# Patient Record
Sex: Female | Born: 1995 | Race: Black or African American | Hispanic: No | Marital: Married | Smoking: Never smoker
Health system: Southern US, Community
[De-identification: ages and names within clinical notes are randomized; demographics above are authoritative.]

## PROBLEM LIST (undated history)

## (undated) DIAGNOSIS — J45909 Unspecified asthma, uncomplicated: Secondary | ICD-10-CM

## (undated) DIAGNOSIS — O24419 Gestational diabetes mellitus in pregnancy, unspecified control: Secondary | ICD-10-CM

## (undated) DIAGNOSIS — Z789 Other specified health status: Secondary | ICD-10-CM

## (undated) DIAGNOSIS — K5900 Constipation, unspecified: Secondary | ICD-10-CM

## (undated) DIAGNOSIS — R519 Headache, unspecified: Secondary | ICD-10-CM

## (undated) DIAGNOSIS — E739 Lactose intolerance, unspecified: Secondary | ICD-10-CM

## (undated) DIAGNOSIS — R0602 Shortness of breath: Secondary | ICD-10-CM

## (undated) DIAGNOSIS — M2550 Pain in unspecified joint: Secondary | ICD-10-CM

## (undated) DIAGNOSIS — M549 Dorsalgia, unspecified: Secondary | ICD-10-CM

## (undated) DIAGNOSIS — I10 Essential (primary) hypertension: Secondary | ICD-10-CM

## (undated) DIAGNOSIS — R600 Localized edema: Secondary | ICD-10-CM

## (undated) HISTORY — DX: Headache, unspecified: R51.9

## (undated) HISTORY — DX: Shortness of breath: R06.02

## (undated) HISTORY — DX: Lactose intolerance, unspecified: E73.9

## (undated) HISTORY — DX: Unspecified asthma, uncomplicated: J45.909

## (undated) HISTORY — DX: Constipation, unspecified: K59.00

## (undated) HISTORY — DX: Pain in unspecified joint: M25.50

## (undated) HISTORY — DX: Other specified health status: Z78.9

## (undated) HISTORY — DX: Gestational diabetes mellitus in pregnancy, unspecified control: O24.419

## (undated) HISTORY — DX: Dorsalgia, unspecified: M54.9

## (undated) HISTORY — DX: Localized edema: R60.0

## (undated) HISTORY — DX: Essential (primary) hypertension: I10

## (undated) HISTORY — PX: NO PAST SURGERIES: SHX2092

---
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
## 2019-04-15 ENCOUNTER — Other Ambulatory Visit: Payer: Self-pay | Admitting: *Deleted

## 2019-04-15 DIAGNOSIS — Z20822 Contact with and (suspected) exposure to covid-19: Secondary | ICD-10-CM

## 2019-04-16 LAB — NOVEL CORONAVIRUS, NAA: SARS-CoV-2, NAA: NOT DETECTED

## 2019-04-28 ENCOUNTER — Other Ambulatory Visit: Payer: Self-pay

## 2019-04-28 DIAGNOSIS — Z20822 Contact with and (suspected) exposure to covid-19: Secondary | ICD-10-CM

## 2019-04-29 LAB — NOVEL CORONAVIRUS, NAA: SARS-CoV-2, NAA: DETECTED — AB

## 2019-05-01 ENCOUNTER — Telehealth: Payer: Self-pay | Admitting: *Deleted

## 2019-05-01 NOTE — Telephone Encounter (Signed)
Patient and husband both positive for [phone]. They have spoken previously with a nurse. Had questions regarding isolation issues. Answered questions at this time. Encouraged to talk to her pcp with any further concerns.

## 2019-05-01 NOTE — Telephone Encounter (Signed)
This encounter was created in error - please disregard.

## 2019-07-15 ENCOUNTER — Other Ambulatory Visit: Payer: Self-pay

## 2019-07-15 DIAGNOSIS — Z20822 Contact with and (suspected) exposure to covid-19: Secondary | ICD-10-CM

## 2019-07-17 LAB — NOVEL CORONAVIRUS, NAA: SARS-CoV-2, NAA: NOT DETECTED

## 2019-11-22 ENCOUNTER — Ambulatory Visit: Payer: BC Managed Care – PPO

## 2019-11-22 ENCOUNTER — Ambulatory Visit: Payer: BC Managed Care – PPO | Attending: Internal Medicine

## 2019-11-22 DIAGNOSIS — Z23 Encounter for immunization: Secondary | ICD-10-CM

## 2019-11-22 NOTE — Progress Notes (Signed)
   Covid-19 Vaccination Clinic  Name:  BLEN RANSOME    MRN: [id number] DOB: 1996-03-31  11/22/2019  Ms. Criss Alvine was observed post Covid-19 immunization for 15 minutes without incident. She was provided with Vaccine Information Sheet and instruction to access the V-Safe system.   Ms. Criss Alvine was instructed to call 911 with any severe reactions post vaccine: Marland Kitchen Difficulty breathing  . Swelling of face and throat  . A fast heartbeat  . A bad rash all over body  . Dizziness and weakness   Immunizations Administered    Name Date Dose VIS Date Route   Pfizer COVID-19 Vaccine 11/22/2019 11:21 AM 0.3 mL 07/25/2019 Intramuscular   Manufacturer: [employer], [employer]   Lot: [id number]   NDC: [address]-3

## 2019-11-26 ENCOUNTER — Other Ambulatory Visit: Payer: Self-pay

## 2019-11-26 ENCOUNTER — Ambulatory Visit: Payer: BC Managed Care – PPO | Attending: Internal Medicine

## 2019-11-26 DIAGNOSIS — Z20822 Contact with and (suspected) exposure to covid-19: Secondary | ICD-10-CM

## 2019-11-27 LAB — NOVEL CORONAVIRUS, NAA: SARS-CoV-2, NAA: NOT DETECTED

## 2019-11-27 LAB — SARS-COV-2, NAA 2 DAY TAT

## 2019-12-15 ENCOUNTER — Ambulatory Visit: Payer: BC Managed Care – PPO | Attending: Internal Medicine

## 2019-12-15 DIAGNOSIS — Z23 Encounter for immunization: Secondary | ICD-10-CM

## 2019-12-15 NOTE — Progress Notes (Signed)
   Covid-19 Vaccination Clinic  Name:  EMBERLEY KRAL    MRN: [id number] DOB: 1996/01/03  12/15/2019  Ms. Criss Alvine was observed post Covid-19 immunization for 15 minutes without incident. She was provided with Vaccine Information Sheet and instruction to access the V-Safe system.   Ms. Criss Alvine was instructed to call 911 with any severe reactions post vaccine: Marland Kitchen Difficulty breathing  . Swelling of face and throat  . A fast heartbeat  . A bad rash all over body  . Dizziness and weakness   Immunizations Administered    Name Date Dose VIS Date Route   Pfizer COVID-19 Vaccine 12/15/2019 12:37 PM 0.3 mL 10/08/2018 Intramuscular   Manufacturer: [employer], [employer]   Lot: [id number]   NDC: [address]-5

## 2020-03-31 ENCOUNTER — Other Ambulatory Visit: Payer: Self-pay | Admitting: Critical Care Medicine

## 2020-03-31 ENCOUNTER — Other Ambulatory Visit: Payer: BC Managed Care – PPO

## 2020-03-31 DIAGNOSIS — Z20822 Contact with and (suspected) exposure to covid-19: Secondary | ICD-10-CM

## 2020-04-02 LAB — SARS-COV-2, NAA 2 DAY TAT

## 2020-04-02 LAB — NOVEL CORONAVIRUS, NAA: SARS-CoV-2, NAA: NOT DETECTED

## 2020-04-07 ENCOUNTER — Other Ambulatory Visit: Payer: Self-pay

## 2020-06-07 LAB — OB RESULTS CONSOLE GC/CHLAMYDIA
Chlamydia: NEGATIVE
Gonorrhea: NEGATIVE

## 2020-06-07 LAB — OB RESULTS CONSOLE ABO/RH: RH Type: POSITIVE

## 2020-06-07 LAB — OB RESULTS CONSOLE HEPATITIS B SURFACE ANTIGEN: Hepatitis B Surface Ag: NEGATIVE

## 2020-06-07 LAB — OB RESULTS CONSOLE RUBELLA ANTIBODY, IGM: Rubella: IMMUNE

## 2020-06-07 LAB — OB RESULTS CONSOLE RPR: RPR: NONREACTIVE

## 2020-06-07 LAB — OB RESULTS CONSOLE HIV ANTIBODY (ROUTINE TESTING): HIV: NONREACTIVE

## 2020-06-07 LAB — OB RESULTS CONSOLE ANTIBODY SCREEN: Antibody Screen: NEGATIVE

## 2020-08-14 NOTE — L&D Delivery Note (Signed)
Delivery Note Arrived to room approximately 1745. Patient was pushing and fetus was at +1 station. Excellent maternal effort was noted with pushing. Pushing occurred for greater than 2 hours. Given some decelerations which occurred during pushing, patient was consented to vacuum assisted delivery. We discussed risks/benefits/alternatives to vacuum delivery.   Fetal heart tracing was reassuring and, therefore, pushing was continued and fetus successfully reached +2 station. The fetal heart tracing revealed fetal tachycardia with FHR in 170-180s with minimal to moderate variability and without decelerations. Due to significant fetal caput, unable to palpate sutures in order to properly place a vacuum. Dr. Macon Large (faculty practice) called to bedside to aid with evaluating placement of vacuum. It was determined that it was not safe to place the vacuum given inability to palpate sutures. Gentle fundal pressure was applied (Kristeller maneuver) which aided with delivery of the infant.  At 7:56 PM a viable female was delivered via Vaginal, Spontaneous (Presentation: Left Occiput Anterior).  APGAR: 5, 7; weight: 4130g. Terminal meconium was noted. The infant was immediately taken to the warmer. Placenta status: Spontaneous;Expressed, Intact.  Cord: 3 vessels with the following complications: None.  Cord pH: Collected  There were no cervical or perineal lacerations. Bilateral superficial vaginal sulcal tears were noted but they were found to be hemostatic and were not repaired.  Anesthesia: Epidural Episiotomy: None Lacerations: None Suture Repair: N/A Est. Blood Loss (mL): 500  Mom to postpartum.  Baby to NICU.  Steva Ready 12/30/2020, 8:30 PM

## 2020-10-11 ENCOUNTER — Inpatient Hospital Stay (HOSPITAL_COMMUNITY): Admission: AD | Admit: 2020-10-11 | Payer: BC Managed Care – PPO | Admitting: Obstetrics and Gynecology

## 2020-12-16 ENCOUNTER — Telehealth (HOSPITAL_COMMUNITY): Payer: Self-pay | Admitting: *Deleted

## 2020-12-16 ENCOUNTER — Encounter (HOSPITAL_COMMUNITY): Payer: Self-pay | Admitting: *Deleted

## 2020-12-16 NOTE — Telephone Encounter (Signed)
Preadmission screen  

## 2020-12-17 ENCOUNTER — Encounter (HOSPITAL_COMMUNITY): Payer: Self-pay | Admitting: *Deleted

## 2020-12-21 DIAGNOSIS — Z3403 Encounter for supervision of normal first pregnancy, third trimester: Secondary | ICD-10-CM

## 2020-12-21 NOTE — H&P (Signed)
HPI: 25 y.o. G1P0 @ [redacted]w[redacted]d estimated gestational age (as dated by LMP c/w 9 week ultrasound) presents for induction of labor for post-dates.  Leakage of fluid:  No Vaginal bleeding:  No Contractions:  No Fetal movement:  Yes  Prenatal care has been provided by Dr. Steva Ready [facility])  ROS:  Denies fevers, chills, chest pain, visual changes, SOB, RUQ/epigastric pain, N/V, dysuria, hematuria, or sudden onset/worsening bilateral LE or facial edema.  Pregnancy complicated by: Uncomplicated  Prenatal Transfer Tool  Maternal Diabetes: No Genetic Screening: Declined Maternal Ultrasounds/Referrals: Normal Fetal Ultrasounds or other Referrals:  None Maternal Substance Abuse:  No Significant Maternal Medications:  None Significant Maternal Lab Results: Group B Strep positive   Prenatal Labs Blood type:  O Positive Antibody screen:  Negative CBC:  H/H 12.4/36.1 Rubella: Immune RPR:  Non-reactive Hep B:  Negative Hep C:  Negative HIV:  Negative GC/CT:  Negative Glucola:  150.2 (abnormal)  3h GTT passed  Immunizations: Tdap: Given prenatally  OBHx:  OB History    Gravida  1   Para      Term      Preterm      AB      Living        SAB      IAB      Ectopic      Multiple      Live Births             PMHx:  None Meds:  PNV Allergy:  No Known Allergies SurgHx: None SocHx:   Denies Tobacco, ETOH, illicit drugs  O: There were no vitals taken for this visit. Gen. AAOx3, NAD CV.  RRR  Resp. CTAB, no wheezes/rales/rhonchi Abd. Gravid, soft, non-tender throughout, no rebound/guarding, Cephalic by [employer].  No bilateral LE edema, no calf tenderness bilaterally SVE: 1/thick/high  Labs: see orders  A/P:  25 y.o. G1P0 @ [redacted]w[redacted]d who presents for induction of labor for post-dates.  - Admit to L&D - Admit labs (CBC, T&S, COVID screen) - CEFM/Toco - Diet:  Clear liquids - IVF:  LR at 125cc/hour - VTE Prophylaxis:  SCDs - GBS Status:  Positive  - PCN ordered - Presentation:  Confirm prior to IOL - confirm via [place] - Pain control:  Per patient request - Induction method:  Cytotec for cervical ripening  Steva Ready, DO [phone] (office)

## 2020-12-27 ENCOUNTER — Other Ambulatory Visit (HOSPITAL_COMMUNITY)
Admission: RE | Admit: 2020-12-27 | Discharge: 2020-12-27 | Disposition: A | Discharge status: Home / Self Care | Payer: BC Managed Care – PPO | Source: Ambulatory Visit | Attending: Obstetrics and Gynecology | Admitting: Obstetrics and Gynecology

## 2020-12-27 DIAGNOSIS — Z20822 Contact with and (suspected) exposure to covid-19: Secondary | ICD-10-CM | POA: Insufficient documentation

## 2020-12-27 DIAGNOSIS — Z01812 Encounter for preprocedural laboratory examination: Secondary | ICD-10-CM | POA: Insufficient documentation

## 2020-12-27 LAB — SARS CORONAVIRUS 2 (TAT 6-24 HRS): SARS Coronavirus 2: NEGATIVE

## 2020-12-29 ENCOUNTER — Inpatient Hospital Stay (HOSPITAL_COMMUNITY)
Admission: AD | Admit: 2020-12-29 | Discharge: 2021-01-01 | DRG: 806 | Disposition: A | Discharge status: Home / Self Care | Payer: BC Managed Care – PPO | Attending: Obstetrics and Gynecology | Admitting: Obstetrics and Gynecology

## 2020-12-29 ENCOUNTER — Inpatient Hospital Stay (HOSPITAL_COMMUNITY): Payer: BC Managed Care – PPO

## 2020-12-29 ENCOUNTER — Inpatient Hospital Stay (HOSPITAL_BASED_OUTPATIENT_CLINIC_OR_DEPARTMENT_OTHER): Payer: BC Managed Care – PPO

## 2020-12-29 ENCOUNTER — Encounter (HOSPITAL_COMMUNITY): Payer: Self-pay | Admitting: Obstetrics and Gynecology

## 2020-12-29 ENCOUNTER — Other Ambulatory Visit: Payer: Self-pay

## 2020-12-29 DIAGNOSIS — O480 Post-term pregnancy: Secondary | ICD-10-CM

## 2020-12-29 DIAGNOSIS — Z3A40 40 weeks gestation of pregnancy: Secondary | ICD-10-CM

## 2020-12-29 DIAGNOSIS — O134 Gestational [pregnancy-induced] hypertension without significant proteinuria, complicating childbirth: Secondary | ICD-10-CM | POA: Diagnosis present

## 2020-12-29 DIAGNOSIS — O99824 Streptococcus B carrier state complicating childbirth: Secondary | ICD-10-CM | POA: Diagnosis present

## 2020-12-29 DIAGNOSIS — Z20822 Contact with and (suspected) exposure to covid-19: Secondary | ICD-10-CM | POA: Diagnosis present

## 2020-12-29 DIAGNOSIS — Z3403 Encounter for supervision of normal first pregnancy, third trimester: Secondary | ICD-10-CM

## 2020-12-29 DIAGNOSIS — O320XX0 Maternal care for unstable lie, not applicable or unspecified: Secondary | ICD-10-CM

## 2020-12-29 DIAGNOSIS — Z3689 Encounter for other specified antenatal screening: Secondary | ICD-10-CM

## 2020-12-29 LAB — CBC
HCT: 39.3 % (ref 36.0–46.0)
Hemoglobin: 13.5 g/dL (ref 12.0–15.0)
MCH: 29.2 pg (ref 26.0–34.0)
MCHC: 34.4 g/dL (ref 30.0–36.0)
MCV: 85.1 fL (ref 80.0–100.0)
Platelets: 267 10*3/uL (ref 150–400)
RBC: 4.62 MIL/uL (ref 3.87–5.11)
RDW: 12.7 % (ref 11.5–15.5)
WBC: 5.6 10*3/uL (ref 4.0–10.5)
nRBC: 0 % (ref 0.0–0.2)

## 2020-12-29 LAB — TYPE AND SCREEN
ABO/RH(D): O POS
Antibody Screen: NEGATIVE

## 2020-12-29 MED ORDER — OXYTOCIN BOLUS FROM INFUSION
333.0000 mL | Freq: Once | INTRAVENOUS | Status: AC
  Administered 2020-12-30: 333 mL via INTRAVENOUS

## 2020-12-29 MED ORDER — LIDOCAINE HCL (PF) 1 % IJ SOLN
30.0000 mL | INTRAMUSCULAR | Status: DC | PRN
  Filled 2020-12-29: qty 30

## 2020-12-29 MED ORDER — TERBUTALINE SULFATE 1 MG/ML IJ SOLN
0.2500 mg | Freq: Once | INTRAMUSCULAR | Status: DC | PRN
  Filled 2020-12-29: qty 1

## 2020-12-29 MED ORDER — MISOPROSTOL 25 MCG QUARTER TABLET
25.0000 ug | ORAL_TABLET | ORAL | Status: DC | PRN
  Administered 2020-12-29 – 2020-12-30 (×3): 25 ug via VAGINAL
  Filled 2020-12-29 (×4): qty 1

## 2020-12-29 MED ORDER — OXYCODONE-ACETAMINOPHEN 5-325 MG PO TABS: 2.0000 | ORAL_TABLET | ORAL | Status: DC | PRN

## 2020-12-29 MED ORDER — OXYCODONE-ACETAMINOPHEN 5-325 MG PO TABS: 1.0000 | ORAL_TABLET | ORAL | Status: DC | PRN

## 2020-12-29 MED ORDER — SODIUM CHLORIDE 0.9 % IV SOLN
5.0000 10*6.[IU] | Freq: Once | INTRAVENOUS | Status: AC
  Administered 2020-12-29: 5 10*6.[IU] via INTRAVENOUS
  Filled 2020-12-29: qty 5

## 2020-12-29 MED ORDER — OXYTOCIN-SODIUM CHLORIDE 30-0.9 UT/500ML-% IV SOLN
2.5000 [IU]/h | INTRAVENOUS | Status: DC
  Administered 2020-12-30: 2.5 [IU]/h via INTRAVENOUS

## 2020-12-29 MED ORDER — FENTANYL CITRATE (PF) 100 MCG/2ML IJ SOLN
50.0000 ug | INTRAMUSCULAR | Status: DC | PRN
  Administered 2020-12-30: 50 ug via INTRAVENOUS
  Filled 2020-12-29: qty 2

## 2020-12-29 MED ORDER — ACETAMINOPHEN 325 MG PO TABS: 650.0000 mg | ORAL_TABLET | ORAL | Status: DC | PRN

## 2020-12-29 MED ORDER — SOD CITRATE-CITRIC ACID 500-334 MG/5ML PO SOLN: 30.0000 mL | ORAL | Status: DC | PRN

## 2020-12-29 MED ORDER — OXYTOCIN-SODIUM CHLORIDE 30-0.9 UT/500ML-% IV SOLN
1.0000 m[IU]/min | INTRAVENOUS | Status: DC
  Administered 2020-12-30: 2 m[IU]/min via INTRAVENOUS
  Filled 2020-12-29: qty 500

## 2020-12-29 MED ORDER — ONDANSETRON HCL 4 MG/2ML IJ SOLN
4.0000 mg | Freq: Four times a day (QID) | INTRAMUSCULAR | Status: DC | PRN
  Administered 2020-12-30 (×2): 4 mg via INTRAVENOUS
  Filled 2020-12-29 (×2): qty 2

## 2020-12-29 MED ORDER — LACTATED RINGERS IV SOLN: INTRAVENOUS | Status: DC

## 2020-12-29 MED ORDER — PENICILLIN G POT IN DEXTROSE 60000 UNIT/ML IV SOLN
3.0000 10*6.[IU] | INTRAVENOUS | Status: DC
  Administered 2020-12-29 – 2020-12-30 (×4): 3 10*6.[IU] via INTRAVENOUS
  Filled 2020-12-29 (×6): qty 50

## 2020-12-29 MED ORDER — LACTATED RINGERS IV SOLN: 500.0000 mL | INTRAVENOUS | Status: DC | PRN

## 2020-12-29 NOTE — Progress Notes (Signed)
OB Progress Note  S: Patient resting comfortably. Significant other and her mother at bedside.  O: BP 130/87   Pulse 84   Temp 97.6 F (36.4 C) (Axillary)   Resp 18   Ht 5' 6.5" (1.689 m)   Wt 99.9 kg   BMI 35.02 kg/m   FHT: 150bpm, moderate variablity, + accels, - decels Toco: q4-5 minutes SVE: deferred - primary RN checked at 1747: cervix was 1/thick/-3 and posterior  Limited OB US: Confirmed cephalic presentation AFI 8.7   A/P: 25 y.o. G1P0 @ [redacted]w[redacted]d admitted for induction of labor for post-dates.  FWB: Cat. I Labor course: S/p Cytotec x 1 dose - continue to ripen cervix overnight Pain: Per patient request GBS: Positive - PCN infusing Anticipate SVD  Steva Ready, DO [phone] (office)

## 2020-12-30 ENCOUNTER — Inpatient Hospital Stay (HOSPITAL_COMMUNITY): Payer: BC Managed Care – PPO | Admitting: Anesthesiology

## 2020-12-30 ENCOUNTER — Encounter (HOSPITAL_COMMUNITY): Payer: Self-pay | Admitting: Obstetrics and Gynecology

## 2020-12-30 LAB — RPR: RPR Ser Ql: NONREACTIVE

## 2020-12-30 MED ORDER — FENTANYL-BUPIVACAINE-NACL 0.5-0.125-0.9 MG/250ML-% EP SOLN
12.0000 mL/h | EPIDURAL | Status: DC | PRN
  Administered 2020-12-30: 12 mL/h via EPIDURAL
  Filled 2020-12-30: qty 250

## 2020-12-30 MED ORDER — PHENYLEPHRINE 40 MCG/ML (10ML) SYRINGE FOR IV PUSH (FOR BLOOD PRESSURE SUPPORT)
80.0000 ug | PREFILLED_SYRINGE | INTRAVENOUS | Status: DC | PRN
  Filled 2020-12-30: qty 10

## 2020-12-30 MED ORDER — ONDANSETRON HCL 4 MG/2ML IJ SOLN: 4.0000 mg | INTRAMUSCULAR | Status: DC | PRN

## 2020-12-30 MED ORDER — ACETAMINOPHEN 325 MG PO TABS
650.0000 mg | ORAL_TABLET | ORAL | Status: DC | PRN
  Administered 2021-01-01: 650 mg via ORAL
  Filled 2020-12-30: qty 2

## 2020-12-30 MED ORDER — EPHEDRINE 5 MG/ML INJ
10.0000 mg | INTRAVENOUS | Status: DC | PRN
  Filled 2020-12-30: qty 2

## 2020-12-30 MED ORDER — WITCH HAZEL-GLYCERIN EX PADS: 1.0000 "application " | MEDICATED_PAD | CUTANEOUS | Status: DC | PRN

## 2020-12-30 MED ORDER — DIPHENHYDRAMINE HCL 25 MG PO CAPS: 25.0000 mg | ORAL_CAPSULE | Freq: Four times a day (QID) | ORAL | Status: DC | PRN

## 2020-12-30 MED ORDER — LACTATED RINGERS IV SOLN
500.0000 mL | Freq: Once | INTRAVENOUS | Status: AC
Start: 2020-12-30 — End: 2020-12-30
  Administered 2020-12-30: 500 mL via INTRAVENOUS

## 2020-12-30 MED ORDER — SIMETHICONE 80 MG PO CHEW: 80.0000 mg | CHEWABLE_TABLET | ORAL | Status: DC | PRN

## 2020-12-30 MED ORDER — COCONUT OIL OIL: 1.0000 "application " | TOPICAL_OIL | Status: DC | PRN

## 2020-12-30 MED ORDER — FENTANYL CITRATE (PF) 100 MCG/2ML IJ SOLN: 100.0000 ug | Freq: Once | INTRAMUSCULAR | Status: DC

## 2020-12-30 MED ORDER — PRENATAL MULTIVITAMIN CH
1.0000 | ORAL_TABLET | Freq: Every day | ORAL | Status: DC
  Administered 2021-01-01: 1 via ORAL
  Filled 2020-12-30 (×2): qty 1

## 2020-12-30 MED ORDER — FENTANYL CITRATE (PF) 100 MCG/2ML IJ SOLN
INTRAMUSCULAR | Status: AC
  Filled 2020-12-30: qty 2

## 2020-12-30 MED ORDER — BENZOCAINE-MENTHOL 20-0.5 % EX AERO
1.0000 "application " | INHALATION_SPRAY | CUTANEOUS | Status: DC | PRN
  Administered 2020-12-30: 1 via TOPICAL
  Filled 2020-12-30: qty 56

## 2020-12-30 MED ORDER — DIPHENHYDRAMINE HCL 50 MG/ML IJ SOLN: 12.5000 mg | INTRAMUSCULAR | Status: DC | PRN

## 2020-12-30 MED ORDER — ZOLPIDEM TARTRATE 5 MG PO TABS: 5.0000 mg | ORAL_TABLET | Freq: Every evening | ORAL | Status: DC | PRN

## 2020-12-30 MED ORDER — SENNOSIDES-DOCUSATE SODIUM 8.6-50 MG PO TABS
2.0000 | ORAL_TABLET | Freq: Every day | ORAL | Status: DC
  Administered 2021-01-01: 2 via ORAL
  Filled 2020-12-30 (×2): qty 2

## 2020-12-30 MED ORDER — DIBUCAINE (PERIANAL) 1 % EX OINT: 1.0000 "application " | TOPICAL_OINTMENT | CUTANEOUS | Status: DC | PRN

## 2020-12-30 MED ORDER — BUPIVACAINE HCL (PF) 0.25 % IJ SOLN
INTRAMUSCULAR | Status: DC | PRN
  Administered 2020-12-30 (×2): 10 mL via EPIDURAL

## 2020-12-30 MED ORDER — IBUPROFEN 800 MG PO TABS
800.0000 mg | ORAL_TABLET | Freq: Three times a day (TID) | ORAL | Status: DC
  Administered 2020-12-30 – 2021-01-01 (×6): 800 mg via ORAL
  Filled 2020-12-30 (×6): qty 1

## 2020-12-30 MED ORDER — LIDOCAINE-EPINEPHRINE (PF) 2 %-1:200000 IJ SOLN
INTRAMUSCULAR | Status: DC | PRN
  Administered 2020-12-30: 4 mL via EPIDURAL

## 2020-12-30 MED ORDER — ONDANSETRON HCL 4 MG PO TABS: 4.0000 mg | ORAL_TABLET | ORAL | Status: DC | PRN

## 2020-12-30 NOTE — Anesthesia Procedure Notes (Signed)
Epidural Patient location during procedure: OB Start time: 12/30/2020 7:40 AM End time: 12/30/2020 7:50 AM  Staffing Anesthesiologist: Elmer Picker, MD Performed: anesthesiologist   Preanesthetic Checklist Completed: patient identified, IV checked, risks and benefits discussed, monitors and equipment checked, pre-op evaluation and timeout performed  Epidural Patient position: sitting Prep: DuraPrep and site prepped and draped Patient monitoring: continuous pulse ox, blood pressure, heart rate and cardiac monitor Approach: midline Location: L3-L4 Injection technique: LOR air  Needle:  Needle type: Tuohy  Needle gauge: 17 G Needle length: 9 cm Needle insertion depth: 7 cm Catheter type: closed end flexible Catheter size: 19 Gauge Catheter at skin depth: 12 cm Test dose: negative  Assessment Sensory level: T8 Events: blood not aspirated, injection not painful, no injection resistance, no paresthesia and negative IV test  Additional Notes Patient identified. Risks/Benefits/Options discussed with patient including but not limited to bleeding, infection, nerve damage, paralysis, failed block, incomplete pain control, headache, blood pressure changes, nausea, vomiting, reactions to medication both or allergic, itching and postpartum back pain. Confirmed with bedside nurse the patient's most recent platelet count. Confirmed with patient that they are not currently taking any anticoagulation, have any bleeding history or any family history of bleeding disorders. Patient expressed understanding and wished to proceed. All questions were answered. Sterile technique was used throughout the entire procedure. Please see nursing notes for vital signs. Test dose was given through epidural catheter and negative prior to continuing to dose epidural or start infusion. Warning signs of high block given to the patient including shortness of breath, tingling/numbness in hands, complete motor block,  or any concerning symptoms with instructions to call for help. Patient was given instructions on fall risk and not to get out of bed. All questions and concerns addressed with instructions to call with any issues or inadequate analgesia.  Reason for block:procedure for pain

## 2020-12-30 NOTE — Anesthesia Preprocedure Evaluation (Signed)
Anesthesia Evaluation  Patient identified by MRN, date of birth, ID band Patient awake    Reviewed: Allergy & Precautions, NPO status , Patient's Chart, lab work & pertinent test results  Airway Mallampati: II  TM Distance: >3 FB Neck ROM: Full    Dental no notable dental hx.    Pulmonary neg pulmonary ROS,    Pulmonary exam normal breath sounds clear to auscultation       Cardiovascular negative cardio ROS Normal cardiovascular exam Rhythm:Regular Rate:Normal     Neuro/Psych negative neurological ROS  negative psych ROS   GI/Hepatic negative GI ROS, Neg liver ROS,   Endo/Other  negative endocrine ROS  Renal/GU negative Renal ROS  negative genitourinary   Musculoskeletal negative musculoskeletal ROS (+)   Abdominal   Peds  Hematology negative hematology ROS (+)   Anesthesia Other Findings IOL for post dates  Reproductive/Obstetrics (+) Pregnancy                             Anesthesia Physical Anesthesia Plan  ASA: II  Anesthesia Plan: Epidural   Post-op Pain Management:    Induction:   PONV Risk Score and Plan: Treatment may vary due to age or medical condition  Airway Management Planned: Natural Airway  Additional Equipment:   Intra-op Plan:   Post-operative Plan:   Informed Consent: I have reviewed the patients History and Physical, chart, labs and discussed the procedure including the risks, benefits and alternatives for the proposed anesthesia with the patient or authorized representative who has indicated his/her understanding and acceptance.       Plan Discussed with: Anesthesiologist  Anesthesia Plan Comments: (Patient identified. Risks, benefits, options discussed with patient including but not limited to bleeding, infection, nerve damage, paralysis, failed block, incomplete pain control, headache, blood pressure changes, nausea, vomiting, reactions to  medication, itching, and post partum back pain. Confirmed with bedside nurse the patient's most recent platelet count. Confirmed with the patient that they are not taking any anticoagulation, have any bleeding history or any family history of bleeding disorders. Patient expressed understanding and wishes to proceed. All questions were answered. )        Anesthesia Quick Evaluation

## 2020-12-30 NOTE — Progress Notes (Signed)
OB Progress Note  S: Patient has epidural but not having pain relief on one side.  O: BP (!) 106/53   Pulse 90   Temp 98.2 F (36.8 C)   Resp 18   Ht 5' 6.5" (1.689 m)   Wt 99.9 kg   SpO2 99%   BMI 35.02 kg/m   FHT: 125bpm, moderate variablity, + accels, - decels Toco: q1-2 minutes SVE: 6-7/90/-2  A/P: 25 y.o. G1P0 @ [redacted]w[redacted]d admitted for induction of labor for post-dates.  FWB: Cat. I Labor course: Pitocin at 23mU/min, IUPC in place and contractions are adequate Pain: Epidural - anesthesia to re-evaluate due to poor pain control GBS: Positive - PCN ordered Anticipate SVD  Steva Ready, DO [phone] (office)

## 2020-12-30 NOTE — Progress Notes (Signed)
OB Progress Note  S: Patient resting comfortably but has to breathe through contractions. Consents to AROM.  O: BP 124/86   Pulse 96   Temp 98.3 F (36.8 C) (Oral)   Resp 18   Ht 5' 6.5" (1.689 m)   Wt 99.9 kg   BMI 35.02 kg/m   FHT: 125bpm, moderate variablity, + accels, - decels Toco: q2-2.5 minutes SVE: 4/80/-3, sutures palpated AROM: Clear, non-odorous, IUPC placed  A/P: 25 y.o. G1P0 @ [redacted]w[redacted]d admitted for induction of labor for post-dates.  FWB: Cat. I Labor course: S/p Cytotec x 3 doses, AROM performed - clear/non-odorous, will start Pitocin if needed Pain: Per patient request, desires epdiural GBS: Positive - s/p 3 doses of PCN Anticipate SVD  Steva Ready, DO [phone] (office)

## 2020-12-31 LAB — COMPREHENSIVE METABOLIC PANEL
ALT: 20 U/L (ref 0–44)
AST: 53 U/L — ABNORMAL HIGH (ref 15–41)
Albumin: 2.4 g/dL — ABNORMAL LOW (ref 3.5–5.0)
Alkaline Phosphatase: 184 U/L — ABNORMAL HIGH (ref 38–126)
Anion gap: 9 (ref 5–15)
BUN: 5 mg/dL — ABNORMAL LOW (ref 6–20)
CO2: 18 mmol/L — ABNORMAL LOW (ref 22–32)
Calcium: 9 mg/dL (ref 8.9–10.3)
Chloride: 107 mmol/L (ref 98–111)
Creatinine, Ser: 0.92 mg/dL (ref 0.44–1.00)
GFR, Estimated: >60 mL/min (ref >60)
Glucose, Bld: 123 mg/dL — ABNORMAL HIGH (ref 70–99)
Potassium: 3.6 mmol/L (ref 3.5–5.1)
Sodium: 134 mmol/L — ABNORMAL LOW (ref 135–145)
Total Bilirubin: 0.6 mg/dL (ref 0.3–1.2)
Total Protein: 5.6 g/dL — ABNORMAL LOW (ref 6.5–8.1)

## 2020-12-31 LAB — LACTATE DEHYDROGENASE: LDH: 260 U/L — ABNORMAL HIGH (ref 98–192)

## 2020-12-31 LAB — CBC
HCT: 33.7 % — ABNORMAL LOW (ref 36.0–46.0)
Hemoglobin: 11.5 g/dL — ABNORMAL LOW (ref 12.0–15.0)
MCH: 29.4 pg (ref 26.0–34.0)
MCHC: 34.1 g/dL (ref 30.0–36.0)
MCV: 86.2 fL (ref 80.0–100.0)
Platelets: 263 10*3/uL (ref 150–400)
RBC: 3.91 MIL/uL (ref 3.87–5.11)
RDW: 13.1 % (ref 11.5–15.5)
WBC: 21.6 10*3/uL — ABNORMAL HIGH (ref 4.0–10.5)
nRBC: 0 % (ref 0.0–0.2)

## 2020-12-31 MED ORDER — IBUPROFEN 800 MG PO TABS: 800.0000 mg | ORAL_TABLET | Freq: Three times a day (TID) | ORAL | 0 refills | Status: DC

## 2020-12-31 NOTE — Anesthesia Postprocedure Evaluation (Signed)
Anesthesia Post Note  Patient: Jasmin Oconnor  Procedure(s) Performed: AN AD HOC LABOR EPIDURAL     Anesthesia Type: Epidural Anesthetic complications: no   No complications documented.  Last Vitals:  Vitals:   12/31/20 0130 12/31/20 0537  BP: 122/80 132/81  Pulse: 92 88  Resp: 18 16  Temp: 36.9 C 36.7 C  SpO2:      Last Pain:  Vitals:   12/31/20 0537  TempSrc: Oral  PainSc:    Pain Goal:                   Rica Records

## 2020-12-31 NOTE — Lactation Note (Signed)
This note was copied from a baby's chart. Lactation Consultation Note LC to NICU for initial visit and RN request. Infant was jittery and difficult to latch because of clamping at breast. LC used 39mm shield to encourage her to open wide and suckle. Infant spent about 15 minutes suckling, although mostly non-nutritive. LC provided education and left mom and baby sts. Will plan return visit to assist prn.   Patient Name: Jasmin Oconnor SLPNP'Y Date: 12/31/2020 Reason for consult: Initial assessment;Primapara;1st time breastfeeding;Term;NICU baby Age:25 hours  Maternal Data Has patient been taught Hand Expression?: Yes Does the patient have breastfeeding experience prior to this delivery?: No Breasts are symmetrical. No significant hx.   Feeding Mother's Current Feeding Choice: Breast Milk  LATCH Score Latch: Repeated attempts needed to sustain latch, nipple held in mouth throughout feeding, stimulation needed to elicit sucking reflex.  Audible Swallowing: None  Type of Nipple: Everted at rest and after stimulation  Comfort (Breast/Nipple): Soft / non-tender  Hold (Positioning): Assistance needed to correctly position infant at breast and maintain latch.  LATCH Score: 6   Lactation Tools Discussed/Used  24 mm nipple shield  Interventions Interventions: Support pillows;Breast compression;Skin to skin;Assisted with latch;Breast feeding basics reviewed;Adjust position  NICU book and LC brochure  Discharge Discharge Education: Outpatient recommendation Pump: Personal  Consult Status Consult Status: Follow-up Date: 01/01/21 Follow-up type: In-patient   Elder Negus, MA IBCLC 12/31/2020, 11:49 AM

## 2020-12-31 NOTE — Lactation Note (Signed)
This note was copied from a baby's chart. Lactation Consultation Note  Patient Name: Jasmin Oconnor Date: 12/31/2020 Reason for consult: Initial assessment Age:24 hours   Initial Lactation Visit:  Attempted to visit with mother, however, she is not in her room.  Baby is in NICU and DEBP has not been set up yet.  Will plan to return for pump set up and initiation if baby is not transferred back to the unit this morning.   Maternal Data    Feeding    LATCH Score                    Lactation Tools Discussed/Used    Interventions    Discharge    Consult Status Consult Status: Follow-up Date: 12/31/20 Follow-up type: In-patient    Banesa Tristan R Noralee Dutko 12/31/2020, 7:23 AM

## 2020-12-31 NOTE — Lactation Note (Signed)
This note was copied from a baby's chart. Lactation Consultation Note  Patient Name: Jasmin Oconnor [id number] Date: 12/31/2020 Reason for consult: Initial assessment;Primapara;1st time breastfeeding;Term;NICU baby Age:24 hours   Initial LC Visit:  Second attempt to visit with family, however, they have been in the NICU all morning.  Called NICU LC to have mother begin pumping.  NICU LC had just finished visiting with family.  Mother has started pumping; hoping baby will transition back to mother's room on M/B unit later today.   Maternal Data    Feeding Mother's Current Feeding Choice: Breast Milk  LATCH Score                    Lactation Tools Discussed/Used    Interventions    Discharge    Consult Status Consult Status: Follow-up Date: 01/01/21 Follow-up type: In-patient    [name] 12/31/2020, 11:43 AM

## 2020-12-31 NOTE — Progress Notes (Signed)
Postpartum Note Day #1  S:  Patient doing well.  Pain controlled.  Tolerating regular diet.   Ambulating and voiding without difficulty. Patient seen in NICU. Infant doing well and is moving and on room air. Denies fevers, chills, chest pain, SOB, N/V, or worsening bilateral LE edema.  Lochia: Minimal Infant feeding:  Breast Circumcision:  N/A, female infant Contraception:  Undecided  O: Temp:  [97.7 F (36.5 C)-99.2 F (37.3 C)] 98 F (36.7 C) (05/20 0537) Pulse Rate:  [74-136] 88 (05/20 0537) Resp:  [16-18] 16 (05/20 0537) BP: (106-160)/(53-108) 132/81 (05/20 0537) SpO2:  [99 %-100 %] 100 % (05/19 2320) Gen: NAD, pleasant and cooperative CV: RRR Resp: CTAB, no wheezes/rales/rhonchi Abdomen: soft, non-distended, non-tender throughout Uterus: firm, non-tender, below umbilicus Ext: no bilateral LE edema, no bilateral calf tenderness, SCDs on and working  Labs: Recent Labs    12/29/20 1700 12/31/20 0538  HGB 13.5 11.5*    A/P: Patient is a 25 y.o. G1P1001 PPD#1 s/p SVD.  S/p SVD - Pain well controlled  - GU: UOP is adequate - GI: Tolerating regular diet - Activity: encouraged sitting up to chair and ambulation as tolerated - DVT Prophylaxis: SCDs and frequent ambulation - Labs: stable as above  Gestational HTN - Reviewed with patient - Will arrange a 1 week BP check  Disposition:  D/C home PPD#2   Steva Ready, DO [phone] (office)

## 2020-12-31 NOTE — Progress Notes (Signed)
Patient screened out for psychosocial assessment since none of the following apply:  Psychosocial stressors documented in mother or baby's chart  Gestation less than 32 weeks  Code at delivery   Infant with anomalies Please contact the Clinical Social Worker if specific needs arise, by MOB's request, or if MOB scores greater than 9/yes to question 10 on [place] Postpartum Depression Screen.  Magdalina Whitehead, LCSW Clinical Social Worker [facility] Cell#: [phone]     

## 2020-12-31 NOTE — Lactation Note (Signed)
This note was copied from a baby's chart. Lactation Consultation Note  Patient Name: Jasmin Oconnor [id number]'M Date: 12/31/2020 Reason for consult: Follow-up assessment Age:24 hours   Follow Up LC Visit:  Mother resting in bed, father in the NICU.  Mother reported that baby needs to have one more good blood sugar before being transported back to her room.  She is able to express colostrum and fed drops to baby.  Mother will return to the NICU for the next feeding and will call as needed for assistance. Mother has a DEBP set up in NICU.  Suggested she call me if baby does not return to her room today so mother can continue pumping every three hours between her own room and visiting baby in the NICU.  Mother verbalized understanding.   Maternal Data    Feeding Mother's Current Feeding Choice: Breast Milk Nipple Type: Nfant Slow Flow (purple)  LATCH Score                    Lactation Tools Discussed/Used    Interventions    Discharge    Consult Status Consult Status: Follow-up Date: 01/01/21 Follow-up type: In-patient    Lucyle Alumbaugh R Angelique Chevalier 12/31/2020, 3:06 PM

## 2021-01-01 NOTE — Discharge Summary (Signed)
Postpartum Discharge Summary  Date of Service updated 01/01/21     Patient Name: Jasmin Oconnor DOB: 11-20-1995 MRN: [id number]  Date of admission: 12/29/2020 Delivery date:12/30/2020  Delivering provider: Drema Dallas  Date of discharge: 01/01/2021  Admitting diagnosis: Post-dates pregnancy [O48.0] Intrauterine pregnancy: [redacted]w[redacted]d    Secondary diagnosis:  Active Problems:   Encounter for supervision of normal first pregnancy in third trimester   Post-dates pregnancy  Additional problems: none    Discharge diagnosis: Term Pregnancy Delivered                                              Post partum procedures:none Augmentation: AROM and Cytotec Complications: None  Hospital course: Induction of Labor With Vaginal Delivery   25y.o. yo G1P1001 at 429w4das admitted to the hospital 12/29/2020 for induction of labor.  Indication for induction: Postdates.  Patient had an uncomplicated labor course as follows: Membrane Rupture Time/Date: 7:19 AM ,12/30/2020   Delivery Method:Vaginal, Spontaneous  Episiotomy: None  Lacerations:  None  Details of delivery can be found in separate delivery note.  Patient had a routine postpartum course. Patient is discharged home 01/01/21.  Newborn Data: Birth date:12/30/2020  Birth time:7:56 PM  Gender:Female  Living status:Living  Apgars:5 ,7  Weight:4130 g   Magnesium Sulfate received: No BMZ received: No Rhophylac:N/A MMR:N/A Transfusion:No  Physical exam  Vitals:   12/31/20 0537 12/31/20 1224 12/31/20 2116 01/01/21 0555  BP: 132/81 129/82 122/90 103/65  Pulse: 88 93 79 70  Resp: 16 19 19 16   Temp: 98 F (36.7 C) (!) 97.5 F (36.4 C) 98.6 F (37 C) 97.8 F (36.6 C)  TempSrc: Oral Oral Oral Oral  SpO2:  98% 99%   Weight:      Height:       General: alert, cooperative and no distress Lochia: appropriate Uterine Fundus: firm Incision: N/A DVT Evaluation: No evidence of DVT seen on physical exam. No cords or calf  tenderness. No significant calf/ankle edema. Labs: Lab Results  Component Value Date   WBC 21.6 (H) 12/31/2020   HGB 11.5 (L) 12/31/2020   HCT 33.7 (L) 12/31/2020   MCV 86.2 12/31/2020   PLT 263 12/31/2020   CMP Latest Ref Rng & Units 12/31/2020  Glucose 70 - 99 mg/dL 123(H)  BUN 6 - 20 mg/dL 5(L)  Creatinine 0.44 - 1.00 mg/dL 0.92  Sodium 135 - 145 mmol/L 134(L)  Potassium 3.5 - 5.1 mmol/L 3.6  Chloride 98 - 111 mmol/L 107  CO2 22 - 32 mmol/L 18(L)  Calcium 8.9 - 10.3 mg/dL 9.0  Total Protein 6.5 - 8.1 g/dL 5.6(L)  Total Bilirubin 0.3 - 1.2 mg/dL 0.6  Alkaline Phos 38 - 126 U/L 184(H)  AST 15 - 41 U/L 53(H)  ALT 0 - 44 U/L 20   Edinburgh Score: Edinburgh Postnatal Depression Scale Screening Tool 01/01/2021  I have been able to laugh and see the funny side of things. 0  I have looked forward with enjoyment to things. 0  I have blamed myself unnecessarily when things went wrong. 1  I have been anxious or worried for no good reason. 0  I have felt scared or panicky for no good reason. 0  Things have been getting on top of me. 1  I have been so unhappy that I have had difficulty sleeping. 0  I have felt sad or miserable. 0  I have been so unhappy that I have been crying. 0  The thought of harming myself has occurred to me. 0  Edinburgh Postnatal Depression Scale Total 2      After visit meds:  Allergies as of 01/01/2021   No Known Allergies     Medication List    TAKE these medications   ibuprofen 800 MG tablet Commonly known as: ADVIL Take 1 tablet (800 mg total) by mouth every 8 (eight) hours.        Discharge home in stable condition Infant Feeding: Bottle and Breast Infant Disposition:NICU Discharge instruction: per After Visit Summary and Postpartum booklet. Activity: Advance as tolerated. Pelvic rest for 6 weeks.  Diet: low salt diet Anticipated Birth Control: Unsure and counseled on options Postpartum Appointment:6 weeks Additional Postpartum F/U:  BP check 1 week Future Appointments:No future appointments. Follow up Visit:  Follow-up Information    Drema Dallas, DO Follow up in 1 week(s).   Specialty: Obstetrics and Gynecology Why: We will arrange a 1 week blood pressure check and your 6 week postpartum visit. Contact information: [address] [place] [address] [phone]                   01/01/2021 Arrie Eastern, CNM

## 2021-01-02 ENCOUNTER — Ambulatory Visit: Payer: Self-pay

## 2021-01-02 NOTE — Lactation Note (Addendum)
This note was copied from a baby's chart. Lactation Consultation Note  Patient Name: Jasmin Oconnor MOQHU'T Date: 01/02/2021 Reason for consult: Follow-up assessment;Mother's request;Primapara;1st time breastfeeding;NICU baby;Term Age:24 hours  Lactation followed up with [name] upon request. [name] is now 83 hours old. [name]' milk has transitioned. She is pumping strong volumes for day 3. Her breasts are full, particularly her right breast. She is pumping q3 hours. I discussed adding in comfort pumping today as needed, and hand expression to relieve pressure. I provided her with ice and recommended putting ice on the breast for 15 minutes on and off throughout the day (3-4 times).   I checked her flanges. She is using a size 27. She may do well with a size 24. She pumped one hour prior to my visit. Flange fit/pump observation may be beneficial for her in follow up.  [name] states that she is currently rooming in and using the Symphony pump.  I provided a belly band to her RN to use as a pumping bra.  Maternal Data Does the patient have breastfeeding experience prior to this delivery?: No  Feeding Mother's Current Feeding Choice: Breast Milk   Lactation Tools Discussed/Used Tools: Other (comment);Pump;Flanges (ice) Flange Size: 27 Breast pump type: Double-Electric Breast Pump Pump Education: Setup, frequency, and cleaning Reason for Pumping: NICU Pumping frequency: q3 Pumped volume: 115 mL (mls)  Interventions Interventions: Ice;Education  Discharge    Consult Status Consult Status: Follow-up Date: 01/03/21 Follow-up type: In-patient    [name] 01/02/2021, 12:53 PM

## 2021-01-04 ENCOUNTER — Ambulatory Visit: Payer: Self-pay

## 2021-01-04 NOTE — Lactation Note (Signed)
This note was copied from a baby's chart. Lactation Consultation Note Mother's milk is in. She is pumping q3 and denies engorgement.   Patient Name: Jasmin Oconnor KGOVP'C Date: 01/04/2021 Reason for consult: NICU baby;Follow-up assessment Age:24 days   Feeding Mother's Current Feeding Choice: Breast Milk   Interventions Interventions: Breast feeding basics reviewed;Education;DEBP  Discharge Discharge Education: Engorgement and breast care  Consult Status Consult Status: Follow-up Follow-up type: In-patient   Elder Negus, MA IBCLC 01/04/2021, 3:11 PM

## 2021-01-10 ENCOUNTER — Ambulatory Visit: Payer: Self-pay

## 2021-01-10 NOTE — Lactation Note (Signed)
This note was copied from a baby's chart. Lactation Consultation Note  Patient Name: Jasmin Oconnor OMBTD'H Date: 01/10/2021 Reason for consult: Follow-up assessment;NICU baby;1st time breastfeeding;Primapara;Term Age:24 days   LC in to visit with P1 Mom and FOB on day of baby's discharge.  Baby on phenobarbital for seizures and abnormal MRI.  Baby has been exclusively bottle feeding EBM, volumes of <80 ml today.  Mom has been pumping consistently every 3 hrs, using the [employer] Symphony DEBP in baby's room.  Mom has a big milk supply.  Mom desires to eventually direct breastfeed.  Discussed OP lactation F/U and Mom very interested.  Phone numbers for lactation help and OP appt provided.  LC sent an Epic message to Clinic for OP appt. Request made for referral from NNP.  Baby started cueing in crib.  Offered to assist with breastfeeding, Mom hesitant as she doesn't want to frustrate baby.  Parents packing up the room currently.    Encouraged lots of STS, pumping both breasts when baby eats with feeding cues.    Mom has a [employer]inoh DEBP at home.  Mom has not used it yet as she has been rooming-in with baby using the Symphony pump.  Gently shared that her home pump may not be as effective at removing milk.  Symphony rental pump in gift shop is an option.   Lactation Tools Discussed/Used Tools: Pump Breast pump type: Double-Electric Breast Pump Pumping frequency: Q 3hrs Pumped volume: 240 mL  Discharge Discharge Education: Engorgement and breast care;Outpatient Epic message sent;Outpatient recommendation Pump: Personal (Lansinoh DEBP) WIC Program: No  Consult Status Consult Status: Complete Date: 01/10/21 Follow-up type: Call as needed    [name] 01/10/2021, 10:50 AM

## 2021-07-26 ENCOUNTER — Ambulatory Visit (HOSPITAL_COMMUNITY)
Admission: EM | Admit: 2021-07-26 | Discharge: 2021-07-26 | Disposition: A | Discharge status: Home / Self Care | Payer: BC Managed Care – PPO | Attending: Physician Assistant | Admitting: Physician Assistant

## 2021-07-26 ENCOUNTER — Other Ambulatory Visit: Payer: Self-pay

## 2021-07-26 ENCOUNTER — Encounter (HOSPITAL_COMMUNITY): Payer: Self-pay | Admitting: Emergency Medicine

## 2021-07-26 DIAGNOSIS — R1030 Lower abdominal pain, unspecified: Secondary | ICD-10-CM | POA: Diagnosis present

## 2021-07-26 DIAGNOSIS — Z113 Encounter for screening for infections with a predominantly sexual mode of transmission: Secondary | ICD-10-CM | POA: Insufficient documentation

## 2021-07-26 DIAGNOSIS — N946 Dysmenorrhea, unspecified: Secondary | ICD-10-CM | POA: Insufficient documentation

## 2021-07-26 LAB — POCT URINALYSIS DIPSTICK, ED / UC
Glucose, UA: NEGATIVE mg/dL
Ketones, ur: 15 mg/dL — AB
Leukocytes,Ua: NEGATIVE
Nitrite: NEGATIVE
Protein, ur: 30 mg/dL — AB
Specific Gravity, Urine: >=1.03 (ref 1.005–1.030)
Urobilinogen, UA: 1 mg/dL (ref 0.0–1.0)
pH: 6 (ref 5.0–8.0)

## 2021-07-26 LAB — POC URINE PREG, ED: Preg Test, Ur: NEGATIVE

## 2021-07-26 MED ORDER — IBUPROFEN 800 MG PO TABS: 800.0000 mg | ORAL_TABLET | Freq: Three times a day (TID) | ORAL | 0 refills | Status: DC

## 2021-07-26 NOTE — Discharge Instructions (Signed)
Your exam was normal which is great news.  We will contact you if we need to arrange any treatment based on your swab results.  Start ibuprofen 3 times a day.  You should not take additional NSAIDs including aspirin, ibuprofen/Advil, naproxen/Aleve with this medication as a cause stomach bleeding.  I do recommend he follow-up with OB/GYN as we discussed to consider additional birth control options and treatment if necessary.  If you have any severe symptoms including severe pain, fever, nausea, vomiting, lightheadedness, heart racing, shortness of breath you need to go to the emergency room.

## 2021-07-26 NOTE — ED Triage Notes (Signed)
Abdominal pain started 07/21/2021.  Pain in lower abdomen, upper thigh and lower back, intermittent.  Reports pain is cramping.  Vaginal bleeding started yesterday.  Patient had a baby in 12/2020.  Patient started depo shot after birth of child.   Last time patient urinated around 10:30 today.  Denies painful urination

## 2021-07-26 NOTE — ED Provider Notes (Signed)
[facility]    CSN: [id number] Arrival date & time: 07/26/21  1252      History   Chief Complaint Chief Complaint  Patient presents with   Abdominal Pain    HPI Jasmin Oconnor is a 25 y.o. female.   Patient presents today with a several day history of intermittent lower abdominal pain.  Reports this became severe while she was at work yesterday to the point that her pain was rated 10 on a 0-10 pain scale and was an intense cramping similar to labor pain.  She did start her menstrual cycle 2 days ago but denies history of dysmenorrhea or similar symptoms in the past.  She denies any personal history of fibroids but does have a family history.  Reports that her menstrual cycle is heavier at this time which is also caused some concern.  She denies any vomiting but has had nausea and diarrhea.  She has not tried any over-the-counter medication for symptom management.  She is currently on Depo-Provera with next injection due 08/24/2021.  She denies previous abdominal surgeries and still has her gallbladder and appendix.  Denies any urinary symptoms including frequency, dysuria, hematuria.  Reports that symptoms have improved and currently pain is rated 1 on a 0-10 pain scale, localized to lower abdomen, described as discomfort, no aggravating relieving factors identified.   Past Medical History:  Diagnosis Date   Medical history non-contributory     Patient Active Problem List   Diagnosis Date Noted   Post-dates pregnancy 12/29/2020   Encounter for supervision of normal first pregnancy in third trimester 12/21/2020    Past Surgical History:  Procedure Laterality Date   NO PAST SURGERIES      OB History     Gravida  1   Para  1   Term  1   Preterm      AB      Living  1      SAB      IAB      Ectopic      Multiple  0   Live Births  1            Home Medications    Prior to Admission medications   Medication Sig Start Date End Date  Taking? Authorizing Provider  ibuprofen (ADVIL) 800 MG tablet Take 1 tablet (800 mg total) by mouth every 8 (eight) hours. 07/26/21   Jordy Hewins, Derry Skill, PA-C  medroxyPROGESTERone (DEPO-PROVERA) 150 MG/ML injection Inject 150 mg into the muscle every 3 (three) months. 06/07/21   [provider]    Family History Family History  Problem Relation Age of Onset   Hypertension Mother    Diabetes Father     Social History Social History   Tobacco Use   Smoking status: Never   Smokeless tobacco: Never  Substance Use Topics   Alcohol use: Yes    Comment: rare   Drug use: Never     Allergies   Patient has no known allergies.   Review of Systems Review of Systems  Constitutional:  Positive for activity change and fatigue. Negative for appetite change and fever.  Respiratory:  Negative for cough and shortness of breath.   Cardiovascular:  Negative for chest pain.  Gastrointestinal:  Positive for abdominal pain, diarrhea and nausea. Negative for vomiting.  Genitourinary:  Positive for pelvic pain. Negative for dysuria, frequency, urgency, vaginal bleeding, vaginal discharge and vaginal pain.  Musculoskeletal:  Positive for back pain. Negative for  arthralgias and myalgias.  Neurological:  Negative for dizziness, light-headedness and headaches.    Physical Exam Triage Vital Signs ED Triage Vitals  Enc Vitals Group     BP 07/26/21 1421 120/70     Pulse Rate 07/26/21 1421 90     Resp 07/26/21 1421 18     Temp 07/26/21 1421 98.1 F (36.7 C)     Temp Source 07/26/21 1421 Oral     SpO2 07/26/21 1421 98 %     Weight --      Height --      Head Circumference --      Peak Flow --      Pain Score 07/26/21 1418 10     Pain Loc --      Pain Edu? --      Excl. in GC? --    No data found.  Updated Vital Signs BP 120/70 (BP Location: Left Arm) Comment (BP Location): large cuff   Pulse 90    Temp 98.1 F (36.7 C) (Oral)    Resp 18    LMP 07/25/2021    SpO2 98%   Visual  Acuity Right Eye Distance:   Left Eye Distance:   Bilateral Distance:    Right Eye Near:   Left Eye Near:    Bilateral Near:     Physical Exam Vitals reviewed. Exam conducted with a chaperone present.  Constitutional:      General: She is awake. She is not in acute distress.    Appearance: Normal appearance. She is well-developed. She is not ill-appearing.     Comments: Very pleasant female appears stated age in no acute distress sitting comfortably in exam room  HENT:     Head: Normocephalic and atraumatic.  Cardiovascular:     Rate and Rhythm: Normal rate and regular rhythm.     Heart sounds: Normal heart sounds, S1 normal and S2 normal. No murmur heard. Pulmonary:     Effort: Pulmonary effort is normal.     Breath sounds: Normal breath sounds. No wheezing, rhonchi or rales.     Comments: Clear to auscultation bilaterally Abdominal:     General: Bowel sounds are normal.     Palpations: Abdomen is soft.     Tenderness: There is generalized abdominal tenderness. There is no right CVA tenderness, left CVA tenderness, guarding or rebound.     Comments: Mild tenderness palpation throughout abdomen.  No evidence of acute abdomen on physical exam.  Genitourinary:    Labia:        Right: No rash or tenderness.        Left: No rash or tenderness.      Vagina: Bleeding present.     Cervix: No cervical motion tenderness.     Adnexa: Right adnexa normal and left adnexa normal.     Comments: Selena Batten, RN chaperone during exam.  Blood noted in vaginal vault without significant discharge.  No pain or fullness with bimanual exam; no CMT or adnexal tenderness. Psychiatric:        Behavior: Behavior is cooperative.     UC Treatments / Results  Labs (all labs ordered are listed, but only abnormal results are displayed) Labs Reviewed  POCT URINALYSIS DIPSTICK, ED / UC - Abnormal; Notable for the following components:      Result Value   Bilirubin Urine SMALL (*)    Ketones, ur 15 (*)     Hgb urine dipstick LARGE (*)    Protein, ur 30 (*)  All other components within normal limits  POC URINE PREG, ED  CERVICOVAGINAL ANCILLARY ONLY    EKG   Radiology No results found.  Procedures Procedures (including critical care time)  Medications Ordered in UC Medications - No data to display  Initial Impression / Assessment and Plan / UC Course  I have reviewed the triage vital signs and the nursing notes.  Pertinent labs & imaging results that were available during my care of the patient were reviewed by me and considered in my medical decision making (see chart for details).     Vital signs and physical exam reassuring today; no indication for emergent evaluation or imaging.  Urine showed blood likely related to menstrual cycle but no evidence of infection.  Patient is not having any UTI symptoms so will defer culture.  Urine pregnancy was negative.  Low suspicion for PID given lack of tenderness and normal exam.  STI swab was collected and sent out; will defer treatment until results are obtained.  Given improvement of symptoms in the past several hours suspect symptoms are related to dysmenorrhea.  We will start ibuprofen 800 mg up to 3 times a day as needed for pain relief and she was instructed not to take additional NSAIDs with this medication due to risk of GI bleeding.  Discussed that there are other birth control options if she is continuing to have dysmenorrhea despite Depo-Provera and encouraged her to follow-up with OB/GYN for further evaluation and management.  Discussed alarm symptoms that warrant emergent evaluation.  Strict return precautions given to which she expressed understanding.  Final Clinical Impressions(s) / UC Diagnoses   Final diagnoses:  Dysmenorrhea  Lower abdominal pain     Discharge Instructions      Your exam was normal which is great news.  We will contact you if we need to arrange any treatment based on your swab results.  Start  ibuprofen 3 times a day.  You should not take additional NSAIDs including aspirin, ibuprofen/Advil, naproxen/Aleve with this medication as a cause stomach bleeding.  I do recommend he follow-up with OB/GYN as we discussed to consider additional birth control options and treatment if necessary.  If you have any severe symptoms including severe pain, fever, nausea, vomiting, lightheadedness, heart racing, shortness of breath you need to go to the emergency room.     ED Prescriptions     Medication Sig Dispense Auth. Provider   ibuprofen (ADVIL) 800 MG tablet Take 1 tablet (800 mg total) by mouth every 8 (eight) hours. 30 tablet Kamarii Buren, Derry Skill, PA-C      PDMP not reviewed this encounter.   Terrilee Croak, PA-C 07/26/21 1504

## 2021-07-27 LAB — CERVICOVAGINAL ANCILLARY ONLY
Bacterial Vaginitis (gardnerella): NEGATIVE
Candida Glabrata: NEGATIVE
Candida Vaginitis: NEGATIVE
Chlamydia: NEGATIVE
Comment: NEGATIVE
Comment: NEGATIVE
Comment: NEGATIVE
Comment: NEGATIVE
Comment: NEGATIVE
Comment: NORMAL
Neisseria Gonorrhea: NEGATIVE
Trichomonas: NEGATIVE

## 2021-11-25 DIAGNOSIS — Z0289 Encounter for other administrative examinations: Secondary | ICD-10-CM

## 2021-12-01 ENCOUNTER — Encounter (INDEPENDENT_AMBULATORY_CARE_PROVIDER_SITE_OTHER): Payer: Self-pay | Admitting: Bariatrics

## 2021-12-01 ENCOUNTER — Ambulatory Visit (INDEPENDENT_AMBULATORY_CARE_PROVIDER_SITE_OTHER): Payer: BC Managed Care – PPO | Admitting: Bariatrics

## 2021-12-01 VITALS — BP 111/73 | HR 77 | Temp 98.3°F | Ht 66.0 in | Wt 218.0 lb

## 2021-12-01 DIAGNOSIS — I10 Essential (primary) hypertension: Secondary | ICD-10-CM

## 2021-12-01 DIAGNOSIS — R5383 Other fatigue: Secondary | ICD-10-CM | POA: Diagnosis not present

## 2021-12-01 DIAGNOSIS — E739 Lactose intolerance, unspecified: Secondary | ICD-10-CM | POA: Diagnosis not present

## 2021-12-01 DIAGNOSIS — E668 Other obesity: Secondary | ICD-10-CM

## 2021-12-01 DIAGNOSIS — R7309 Other abnormal glucose: Secondary | ICD-10-CM

## 2021-12-01 DIAGNOSIS — E559 Vitamin D deficiency, unspecified: Secondary | ICD-10-CM

## 2021-12-01 DIAGNOSIS — Z6835 Body mass index (BMI) 35.0-35.9, adult: Secondary | ICD-10-CM

## 2021-12-01 DIAGNOSIS — Z1331 Encounter for screening for depression: Secondary | ICD-10-CM | POA: Diagnosis not present

## 2021-12-01 DIAGNOSIS — R0602 Shortness of breath: Secondary | ICD-10-CM | POA: Diagnosis not present

## 2021-12-01 DIAGNOSIS — K5900 Constipation, unspecified: Secondary | ICD-10-CM | POA: Diagnosis not present

## 2021-12-02 LAB — TSH+T4F+T3FREE
Free T4: 1.35 ng/dL (ref 0.82–1.77)
T3, Free: 3.3 pg/mL (ref 2.0–4.4)
TSH: 0.331 u[IU]/mL — ABNORMAL LOW (ref 0.450–4.500)

## 2021-12-02 LAB — COMPREHENSIVE METABOLIC PANEL
ALT: 15 IU/L (ref 0–32)
AST: 15 IU/L (ref 0–40)
Albumin/Globulin Ratio: 1.5 (ref 1.2–2.2)
Albumin: 4.6 g/dL (ref 3.9–5.0)
Alkaline Phosphatase: 70 IU/L (ref 44–121)
BUN/Creatinine Ratio: 10 (ref 9–23)
BUN: 8 mg/dL (ref 6–20)
Bilirubin Total: 0.4 mg/dL (ref 0.0–1.2)
CO2: 23 mmol/L (ref 20–29)
Calcium: 9.6 mg/dL (ref 8.7–10.2)
Chloride: 104 mmol/L (ref 96–106)
Creatinine, Ser: 0.81 mg/dL (ref 0.57–1.00)
Globulin, Total: 3 g/dL (ref 1.5–4.5)
Glucose: 94 mg/dL (ref 70–99)
Potassium: 4.6 mmol/L (ref 3.5–5.2)
Sodium: 142 mmol/L (ref 134–144)
Total Protein: 7.6 g/dL (ref 6.0–8.5)
eGFR: 104 mL/min/{1.73_m2} (ref >59)

## 2021-12-02 LAB — LIPID PANEL WITH LDL/HDL RATIO
Cholesterol, Total: 189 mg/dL (ref 100–199)
HDL: 38 mg/dL — ABNORMAL LOW (ref >39)
LDL Chol Calc (NIH): 135 mg/dL — ABNORMAL HIGH (ref 0–99)
LDL/HDL Ratio: 3.6 ratio — ABNORMAL HIGH (ref 0.0–3.2)
Triglycerides: 87 mg/dL (ref 0–149)
VLDL Cholesterol Cal: 16 mg/dL (ref 5–40)

## 2021-12-02 LAB — VITAMIN D 25 HYDROXY (VIT D DEFICIENCY, FRACTURES): Vit D, 25-Hydroxy: 17.9 ng/mL — ABNORMAL LOW (ref 30.0–100.0)

## 2021-12-02 LAB — HEMOGLOBIN A1C
Est. average glucose Bld gHb Est-mCnc: 131 mg/dL
Hgb A1c MFr Bld: 6.2 % — ABNORMAL HIGH (ref 4.8–5.6)

## 2021-12-02 LAB — INSULIN, RANDOM: INSULIN: 17.5 u[IU]/mL (ref 2.6–24.9)

## 2021-12-05 ENCOUNTER — Encounter (INDEPENDENT_AMBULATORY_CARE_PROVIDER_SITE_OTHER): Payer: Self-pay | Admitting: Bariatrics

## 2021-12-05 DIAGNOSIS — E559 Vitamin D deficiency, unspecified: Secondary | ICD-10-CM | POA: Insufficient documentation

## 2021-12-05 DIAGNOSIS — R7989 Other specified abnormal findings of blood chemistry: Secondary | ICD-10-CM | POA: Insufficient documentation

## 2021-12-05 DIAGNOSIS — E786 Lipoprotein deficiency: Secondary | ICD-10-CM | POA: Insufficient documentation

## 2021-12-05 DIAGNOSIS — R7303 Prediabetes: Secondary | ICD-10-CM | POA: Insufficient documentation

## 2021-12-14 ENCOUNTER — Encounter (INDEPENDENT_AMBULATORY_CARE_PROVIDER_SITE_OTHER): Payer: Self-pay | Admitting: Bariatrics

## 2021-12-14 DIAGNOSIS — Z6835 Body mass index (BMI) 35.0-35.9, adult: Secondary | ICD-10-CM | POA: Insufficient documentation

## 2021-12-14 DIAGNOSIS — E6609 Other obesity due to excess calories: Secondary | ICD-10-CM | POA: Insufficient documentation

## 2021-12-14 NOTE — Progress Notes (Signed)
? ? ? ?Chief Complaint:  ? ?OBESITY ?Jasmin Oconnor (MR# [id number]) is a 26 y.o. female who presents for evaluation and treatment of obesity and related comorbidities. Current BMI is Body mass index is 35.19 kg/m?Marland Kitchen Jasmin Oconnor has been struggling with her weight for many years and has been unsuccessful in either losing weight, maintaining weight loss, or reaching her healthy weight goal. ? ?Jasmin Oconnor states that she is lactose intolerant. She states that she enjoys cooking but notes cleaning her kitchen or exhaustion as obstacles.  ? ?Jasmin Oconnor is currently in the action stage of change and ready to dedicate time achieving and maintaining a healthier weight. Jasmin Oconnor is interested in becoming our patient and working on intensive lifestyle modifications including (but not limited to) diet and exercise for weight loss. ? ?Jasmin Oconnor's habits were reviewed today and are as follows: Her family eats meals together, she thinks her family will eat healthier with her, her desired weight loss is 38 pounds, she started gaining weight when she was pregnant and postpartum, her heaviest weight ever was 220 pounds, she has significant food cravings issues, she snacks frequently in the evenings, she wakes up frequently in the middle of the night to eat, she skips meals frequently, she is frequently drinking liquids with calories, she frequently makes poor food choices, she has problems with excessive hunger, she frequently eats larger portions than normal, and she struggles with emotional eating. ? ?Depression Screen ?Jasmin Oconnor's Food and Mood (modified PHQ-9) score was 16. ? ? ?  12/01/2021  ?  8:58 AM  ?Depression screen PHQ 2/9  ?Decreased Interest 3  ?Down, Depressed, Hopeless 2  ?PHQ - 2 Score 5  ?Altered sleeping 2  ?Tired, decreased energy 3  ?Change in appetite 1  ?Feeling bad or failure about yourself  3  ?Trouble concentrating 0  ?Moving slowly or fidgety/restless 2  ?Suicidal thoughts 0  ?PHQ-9 Score 16  ?Difficult doing work/chores  Very difficult  ? ?Subjective:  ? ?1. Other fatigue ?Jasmin Oconnor admits to daytime somnolence and admits to waking up still tired. Patient has a history of symptoms of daytime fatigue, morning fatigue, and morning headache. Jasmin Oconnor generally gets  7.5-8  hours of sleep per night, and states that she has difficulty falling asleep and generally restful sleep. Snoring is present. Apneic episodes are not present. Epworth Sleepiness Score is 6.  Jasmin Oconnor will continue activities.  ? ?2. SOB (shortness of breath) ?Jasmin Oconnor notes increasing shortness of breath with exercising and seems to be worsening over time with weight gain. She notes getting out of breath sooner with activity than she used to. This has not gotten worse recently. Jasmin Oconnor denies shortness of breath at rest or orthopnea.  ? ?3. Essential hypertension ?Jasmin Oconnor is not on medications currently. Her blood pressure is normal today 111/73. ? ?4. Constipation, unspecified constipation type ?Jasmin Oconnor notes constipation mainly with pregnancy. She states constipation is better.  ? ?5. Lactose intolerance ?Jasmin Oconnor notes gas with milk and milkshakes. She denies official diagnoses.  ? ?6. Vitamin D deficiency ?Jasmin Oconnor is not on medications currently.  ? ?7. Elevated glucose ?Jasmin Oconnor is currently not on medications.  ? ?Assessment/Plan:  ? ?1. Other fatigue ?Jasmin Oconnor does feel that her weight is causing her energy to be lower than it should be. Fatigue may be related to obesity, depression or many other causes. Labs will be ordered, and in the meanwhile, Jasmin Oconnor will focus on self care including making healthy food choices, increasing physical activity and focusing on stress reduction. Jasmin Oconnor will gradually  increase activities and exercise. Jasmin Oconnor will check thyroid panel. Jasmin Oconnor will review EKG today.  ? ?- EKG 12-Lead ?- TSH+T4F+T3Free ? ?2. SOB (shortness of breath) ?Jasmin Oconnor does feel that she gets out of breath more easily that she used to when she exercises. Jasmin Oconnor's shortness of breath  appears to be obesity related and exercise induced. She has agreed to work on weight loss and gradually increase exercise to treat her exercise induced shortness of breath. Will continue to monitor closely.  ? ?- TSH+T4F+T3Free ? ?3. Essential hypertension ?Jasmin Oconnor will follow over time. Jasmin Oconnor will check CMP and lipid panel today. Jasmin Oconnor is working on healthy weight loss and exercise to improve blood pressure control. Jasmin Oconnor will watch for signs of hypotension as she continues her lifestyle modifications. ? ?- Lipid Panel With LDL/HDL Ratio ?- Comprehensive metabolic panel ? ?4. Constipation, unspecified constipation type ?Ansleigh was informed that a decrease in bowel movement frequency is normal while losing weight, but stools should not be hard or painful. Jasmin Oconnor will increase her water intake. She will eat raw vegetables. She will use Miralax over the counter and follow up as documented in patient record.  ? ?Counseling ?Getting to Good Bowel Health: Your goal is to have one soft bowel movement each day. Drink at least 8 glasses of water each day. Eat plenty of fiber (goal is over 25 grams each day). It is best to get most of your fiber from dietary sources which includes leafy green vegetables, fresh fruit, and whole grains. You may need to add fiber with the help of OTC fiber supplements. These include Metamucil, Citrucel, and Flaxseed. If you are still having trouble, try adding Miralax or Magnesium Citrate. If all of these changes do not work, Jasmin Oconnor.  ? ?5. Lactose intolerance ?Jasmin Oconnor will watch over time.  ? ?6. Vitamin D deficiency ?Low Vitamin D level contributes to fatigue and are associated with obesity, breast, and colon cancer. Jasmin Oconnor will check Vitamin D today and Jasmin Oconnor  follow-up for routine testing of Vitamin D, at least 2-3 times per year to avoid over-replacement. ? ?- VITAMIN D 25 Hydroxy (Vit-D Deficiency, Fractures) ? ?7. Elevated glucose ?Jasmin Oconnor will check insulin and A1C today.  ? ?- Insulin,  random ?- Hemoglobin A1c ? ?8. Depression screening ?Jasmin Oconnor had a positive depression screening. Depression is commonly associated with obesity and often results in emotional eating behaviors. Jasmin Oconnor will monitor this closely and work on CBT to help improve the non-hunger eating patterns. Referral to Psychology may be required if no improvement is seen as she continues in our clinic.  ? ?9. Class 2 severe obesity with serious comorbidity and body mass index (BMI) of 35.0 to 35.9 in adult, unspecified obesity type ([place]) ?Jasmin Oconnor is currently in the action stage of change and her goal is to continue with weight loss efforts. I recommend Jasmin Oconnor begin the structured treatment plan as follows: ? ?She has agreed to the Category 2 Plan. ? ?Jasmin Oconnor will continue meal planning and she will continue intentional eating. She will have no sugary drinks. Jasmin Oconnor reviewed labs from 12/31/2020 CMP, LDH, CBC, and glucose.  ? ?Exercise goals: Jasmin Oconnor will start jogging and walking.  ? ?Behavioral modification strategies: increasing lean protein intake, decreasing simple carbohydrates, increasing vegetables, increasing water intake, decreasing eating out, no skipping meals, meal planning and cooking strategies, keeping healthy foods in the home, and planning for success. ? ?She was informed of the importance of frequent follow-up visits to maximize her success with intensive lifestyle modifications  for her multiple health conditions. She was informed Jasmin Oconnor would discuss her lab results at her next visit unless there is a critical issue that needs to be addressed sooner. Jasmin Oconnor agreed to keep her next visit at the agreed upon time to discuss these results. ? ?Objective:  ? ?Blood pressure 111/73, pulse 77, temperature 98.3 ?F (36.8 ?C), height 5\' 6"  (1.676 m), weight 218 lb (98.9 kg), last menstrual period 09/14/2021, SpO2 (!) 1 %, unknown if currently breastfeeding. Body mass index is 35.19 kg/m?. ? ?EKG: Normal sinus rhythm, rate 74 bpm. ? ?Indirect  Calorimeter completed today shows a VO2 of 273 and a REE of 1886.  Her calculated basal metabolic rate is 123456 thus her basal metabolic rate is worse than expected. ? ?General: Cooperative, alert, well develo

## 2021-12-15 ENCOUNTER — Ambulatory Visit (INDEPENDENT_AMBULATORY_CARE_PROVIDER_SITE_OTHER): Payer: BC Managed Care – PPO | Admitting: Bariatrics

## 2021-12-15 ENCOUNTER — Encounter (INDEPENDENT_AMBULATORY_CARE_PROVIDER_SITE_OTHER): Payer: Self-pay | Admitting: Bariatrics

## 2021-12-15 VITALS — BP 108/72 | HR 77 | Temp 98.1°F | Ht 66.0 in | Wt 216.0 lb

## 2021-12-15 DIAGNOSIS — E559 Vitamin D deficiency, unspecified: Secondary | ICD-10-CM | POA: Diagnosis not present

## 2021-12-15 DIAGNOSIS — E66812 Obesity, class 2: Secondary | ICD-10-CM

## 2021-12-15 DIAGNOSIS — R7303 Prediabetes: Secondary | ICD-10-CM | POA: Diagnosis not present

## 2021-12-15 DIAGNOSIS — E786 Lipoprotein deficiency: Secondary | ICD-10-CM

## 2021-12-15 DIAGNOSIS — R7989 Other specified abnormal findings of blood chemistry: Secondary | ICD-10-CM

## 2021-12-15 DIAGNOSIS — E669 Obesity, unspecified: Secondary | ICD-10-CM

## 2021-12-15 DIAGNOSIS — Z6834 Body mass index (BMI) 34.0-34.9, adult: Secondary | ICD-10-CM

## 2021-12-21 NOTE — Progress Notes (Signed)
Chief Complaint:   OBESITY Jasmin Oconnor is here to discuss her progress with her obesity treatment plan along with follow-up of her obesity related diagnoses. Jasmin Oconnor is on the Category 2 Plan and states she is following her eating plan approximately 75% of the time. Jasmin Oconnor states she is doing 0 minutes 0 times per week.  Today's visit was #: 2 Starting weight: 218 lbs Starting date: 12/01/2021 Today's weight: 216 lbs Today's date: 12/15/2021 Total lbs lost to date: 2 lbs Total lbs lost since last in-office visit: 2 lbs  Interim History: Jasmin Oconnor is down 2 lbs since her first visit. She states that the plan was hard initially, but doing okay at this time.   Subjective:   1. Low TSH level Jasmin Oconnor is not on thyroid medications currently.   2. Low HDL (under 40) Jasmin Oconnor's HDL has decreased to 38.  3. Vitamin D deficiency Jasmin Oconnor is not on Vitamin D currently.   4. Prediabetes Jasmin Oconnor is currently not on medications. Her last A1C was 6.2.  Assessment/Plan:   1. Low TSH level We will recheck TSH in 6 weeks. Orders and follow up as documented in patient record.  Counseling Good thyroid control is important for overall health. Supratherapeutic thyroid levels are dangerous and will not improve weight loss results. Counseling: The correct way to take levothyroxine is fasting, with water, separated by at least 30 minutes from breakfast, and separated by more than 4 hours from calcium, iron, multivitamins, acid reflux medications (PPIs).    2. Low HDL (under 40) Keyonda will increase exercise (cardio and exercise).  She will decrease carbohydrates.   3. Vitamin D deficiency Low Vitamin D level contributes to fatigue and are associated with obesity, breast, and colon cancer. We will refill prescription Vitamin D 50,000 IU every week for 1 month with no refills and [place] will follow-up for routine testing of Vitamin D, at least 2-3 times per year to avoid over-replacement.  4.  Prediabetes Jasmin Oconnor will have no carbohydrates (decrease sugar and starches). She will exercise. We discussed Metformin. She will  continue to work on weight loss, exercise, and decreasing simple carbohydrates to help decrease the risk of diabetes.   5. Obesity, Current BMI 34.9 Jasmin Oconnor is currently in the action stage of change. As such, her goal is to continue with weight loss efforts. She has agreed to the Category 2 Plan.   Jasmin Oconnor will continue meal planning. Labs noted 12/03/2021 CMP, Lipids, Vitamin D, A1C, and insulin. Prepared Breakfast Options and Eating Out Sheet was provided today.   Exercise goals: Jasmin Oconnor will resume exercise. For substantial health benefits, adults should do at least 150 minutes (2 hours and 30 minutes) a week of moderate-intensity, or 75 minutes (1 hour and 15 minutes) a week of vigorous-intensity aerobic physical activity, or an equivalent combination of moderate- and vigorous-intensity aerobic activity. Aerobic activity should be performed in episodes of at least 10 minutes, and preferably, it should be spread throughout the week.  Behavioral modification strategies: increasing lean protein intake, decreasing simple carbohydrates, increasing vegetables, increasing water intake, decreasing eating out, no skipping meals, meal planning and cooking strategies, keeping healthy foods in the home, and planning for success.  Jasmin Oconnor has agreed to follow-up with our clinic in 3 weeks with a nurse practitioner and 6 weeks with myself. She was informed of the importance of frequent follow-up visits to maximize her success with intensive lifestyle modifications for her multiple health conditions.   Objective:   Blood pressure 108/72, pulse  77, temperature 98.1 F (36.7 C), height 5\' 6"  (1.676 m), SpO2 98 %, unknown if currently breastfeeding. Body mass index is 35.19 kg/m.  General: Cooperative, alert, well developed, in no acute distress. HEENT: Conjunctivae and lids  unremarkable. Cardiovascular: Regular rhythm.  Lungs: Normal work of breathing. Neurologic: No focal deficits.   Lab Results  Component Value Date   CREATININE 0.81 12/01/2021   BUN 8 12/01/2021   NA 142 12/01/2021   K 4.6 12/01/2021   CL 104 12/01/2021   CO2 23 12/01/2021   Lab Results  Component Value Date   ALT 15 12/01/2021   AST 15 12/01/2021   ALKPHOS 70 12/01/2021   BILITOT 0.4 12/01/2021   Lab Results  Component Value Date   HGBA1C 6.2 (H) 12/01/2021   Lab Results  Component Value Date   INSULIN 17.5 12/01/2021   Lab Results  Component Value Date   TSH 0.331 (L) 12/01/2021   Lab Results  Component Value Date   CHOL 189 12/01/2021   HDL 38 (L) 12/01/2021   LDLCALC 135 (H) 12/01/2021   TRIG 87 12/01/2021   Lab Results  Component Value Date   VD25OH 17.9 (L) 12/01/2021   Lab Results  Component Value Date   WBC 21.6 (H) 12/31/2020   HGB 11.5 (L) 12/31/2020   HCT 33.7 (L) 12/31/2020   MCV 86.2 12/31/2020   PLT 263 12/31/2020   No results found for: IRON, TIBC, FERRITIN  Attestation Statements:   Reviewed by clinician on day of visit: allergies, medications, problem list, medical history, surgical history, family history, social history, and previous encounter notes.  I, 01/02/2021, RMA, am acting as [place] for Energy manager, DO.  I have reviewed the above documentation for accuracy and completeness, and I agree with the above. [employer], DO

## 2021-12-26 ENCOUNTER — Telehealth (INDEPENDENT_AMBULATORY_CARE_PROVIDER_SITE_OTHER): Payer: Self-pay | Admitting: Bariatrics

## 2021-12-26 NOTE — Telephone Encounter (Signed)
Pt called, states no prescriptions sent to pharmacy at last appt 12/15/21. Pt request return call to go over what medications will be sent and instructions for taking meds. Pt also mentioned something for prediabetes.

## 2021-12-26 NOTE — Telephone Encounter (Signed)
Dr.Brown 

## 2021-12-26 NOTE — Telephone Encounter (Signed)
This is Dr. Irving Burton patient. She saw patient on 5/4 and discussed Metformin. Can you please advise?  ?

## 2021-12-29 ENCOUNTER — Encounter (INDEPENDENT_AMBULATORY_CARE_PROVIDER_SITE_OTHER): Payer: Self-pay | Admitting: Family Medicine

## 2021-12-29 ENCOUNTER — Other Ambulatory Visit (INDEPENDENT_AMBULATORY_CARE_PROVIDER_SITE_OTHER): Payer: Self-pay | Admitting: Family Medicine

## 2021-12-29 DIAGNOSIS — E559 Vitamin D deficiency, unspecified: Secondary | ICD-10-CM

## 2021-12-29 MED ORDER — VITAMIN D (ERGOCALCIFEROL) 1.25 MG (50000 UNIT) PO CAPS: 50000.0000 [IU] | ORAL_CAPSULE | ORAL | 0 refills | Status: DC

## 2021-12-29 NOTE — Telephone Encounter (Signed)
Please advise 

## 2022-01-02 ENCOUNTER — Other Ambulatory Visit (INDEPENDENT_AMBULATORY_CARE_PROVIDER_SITE_OTHER): Payer: Self-pay | Admitting: Family Medicine

## 2022-01-02 DIAGNOSIS — R7303 Prediabetes: Secondary | ICD-10-CM

## 2022-01-02 MED ORDER — METFORMIN HCL 500 MG PO TABS: 500.0000 mg | ORAL_TABLET | Freq: Every day | ORAL | 0 refills | Status: DC

## 2022-01-04 ENCOUNTER — Encounter (INDEPENDENT_AMBULATORY_CARE_PROVIDER_SITE_OTHER): Payer: Self-pay | Admitting: Bariatrics

## 2022-01-05 ENCOUNTER — Ambulatory Visit (INDEPENDENT_AMBULATORY_CARE_PROVIDER_SITE_OTHER): Payer: BC Managed Care – PPO | Admitting: Nurse Practitioner

## 2022-01-25 ENCOUNTER — Other Ambulatory Visit (INDEPENDENT_AMBULATORY_CARE_PROVIDER_SITE_OTHER): Payer: Self-pay | Admitting: Family Medicine

## 2022-01-25 DIAGNOSIS — R7303 Prediabetes: Secondary | ICD-10-CM

## 2022-01-26 ENCOUNTER — Ambulatory Visit (INDEPENDENT_AMBULATORY_CARE_PROVIDER_SITE_OTHER): Payer: BC Managed Care – PPO | Admitting: Bariatrics

## 2022-01-26 ENCOUNTER — Encounter (INDEPENDENT_AMBULATORY_CARE_PROVIDER_SITE_OTHER): Payer: Self-pay | Admitting: Bariatrics

## 2022-01-26 VITALS — BP 96/64 | HR 78 | Temp 98.0°F | Ht 66.0 in | Wt 215.0 lb

## 2022-01-26 DIAGNOSIS — Z6834 Body mass index (BMI) 34.0-34.9, adult: Secondary | ICD-10-CM

## 2022-01-26 DIAGNOSIS — E669 Obesity, unspecified: Secondary | ICD-10-CM

## 2022-01-26 DIAGNOSIS — R7303 Prediabetes: Secondary | ICD-10-CM

## 2022-01-26 DIAGNOSIS — E559 Vitamin D deficiency, unspecified: Secondary | ICD-10-CM | POA: Diagnosis not present

## 2022-01-26 MED ORDER — VITAMIN D (ERGOCALCIFEROL) 1.25 MG (50000 UNIT) PO CAPS: 50000.0000 [IU] | ORAL_CAPSULE | ORAL | 0 refills | Status: DC

## 2022-01-26 MED ORDER — METFORMIN HCL 500 MG PO TABS: 500.0000 mg | ORAL_TABLET | Freq: Every day | ORAL | 0 refills | Status: DC

## 2022-01-30 NOTE — Progress Notes (Unsigned)
Chief Complaint:   OBESITY Jasmin Oconnor is here to discuss her progress with her obesity treatment plan along with follow-up of her obesity related diagnoses. Jasmin Oconnor is on the Category 2 Plan and states she is following her eating plan approximately 80% of the time. Jasmin Oconnor states she is doing 0 minutes 0 times per week.  Today's visit was #: 3 Starting weight: 218 lbs Starting date: 12/01/2021 Today's weight: 215 lbs Today's date: 01/26/2022 Total lbs lost to date: 3 Total lbs lost since last in-office visit: 1  Interim History: Jasmin Oconnor is down 1 pound since her last visit.  She states that she "fell off".  She has been traveling a lot.  Subjective:   1. Vitamin D deficiency Jasmin Oconnor is taking vitamin D and notes minimal sun exposure.  2. Pre-diabetes Jasmin Oconnor is currently taking metformin.  Assessment/Plan:   1. Vitamin D deficiency We will refill prescription vitamin D 50,000 units weekly for 1 month.  - Vitamin D, Ergocalciferol, (DRISDOL) 1.25 MG (50000 UNIT) CAPS capsule; Take 1 capsule (50,000 Units total) by mouth every 7 (seven) days.  Dispense: 4 capsule; Refill: 0  2. Pre-diabetes We will refill metformin 500 mg daily with breakfast for 1 month. Jasmin Oconnor will keep all carbohydrates low.  We will recheck labs later on 03/02/2022.  She will drink less soda.  - metFORMIN (GLUCOPHAGE) 500 MG tablet; Take 1 tablet (500 mg total) by mouth daily with breakfast.  Dispense: 30 tablet; Refill: 0  3. Obesity, Current BMI 34.8 Jasmin Oconnor is currently in the action stage of change. As such, her goal is to continue with weight loss efforts. She has agreed to the Category 2 Plan.   Jasmin Oconnor will stay on her plan 80-90%. Increase protein. Will begin journaling with MyFitness Pal.   Exercise goals: Wants to be more active with exercise.  Behavioral modification strategies: increasing lean protein intake, decreasing simple carbohydrates, increasing vegetables, increasing water intake, decreasing  eating out, no skipping meals, meal planning and cooking strategies, keeping healthy foods in the home, and planning for success.  Jasmin Oconnor has agreed to follow-up with our clinic in 2 weeks. She was informed of the importance of frequent follow-up visits to maximize her success with intensive lifestyle modifications for her multiple health conditions.   Objective:   Blood pressure 96/64, pulse 78, temperature 98 F (36.7 C), height 5\' 6"  (1.676 m), weight 215 lb (97.5 kg), SpO2 97 %, unknown if currently breastfeeding. Body mass index is 34.7 kg/m.  General: Cooperative, alert, well developed, in no acute distress. HEENT: Conjunctivae and lids unremarkable. Cardiovascular: Regular rhythm.  Lungs: Normal work of breathing. Neurologic: No focal deficits.   Lab Results  Component Value Date   CREATININE 0.81 12/01/2021   BUN 8 12/01/2021   NA 142 12/01/2021   K 4.6 12/01/2021   CL 104 12/01/2021   CO2 23 12/01/2021   Lab Results  Component Value Date   ALT 15 12/01/2021   AST 15 12/01/2021   ALKPHOS 70 12/01/2021   BILITOT 0.4 12/01/2021   Lab Results  Component Value Date   HGBA1C 6.2 (H) 12/01/2021   Lab Results  Component Value Date   INSULIN 17.5 12/01/2021   Lab Results  Component Value Date   TSH 0.331 (L) 12/01/2021   Lab Results  Component Value Date   CHOL 189 12/01/2021   HDL 38 (L) 12/01/2021   LDLCALC 135 (H) 12/01/2021   TRIG 87 12/01/2021   Lab Results  Component Value Date  VD25OH 17.9 (L) 12/01/2021   Lab Results  Component Value Date   WBC 21.6 (H) 12/31/2020   HGB 11.5 (L) 12/31/2020   HCT 33.7 (L) 12/31/2020   MCV 86.2 12/31/2020   PLT 263 12/31/2020   No results found for: "IRON", "TIBC", "FERRITIN"  Attestation Statements:   Reviewed by clinician on day of visit: allergies, medications, problem list, medical history, surgical history, family history, social history, and previous encounter notes.   Trude Mcburney, am acting as  Energy manager for [employer], DO.  I have reviewed the above documentation for accuracy and completeness, and I agree with the above. Corinna Capra, DO

## 2022-01-31 ENCOUNTER — Encounter (INDEPENDENT_AMBULATORY_CARE_PROVIDER_SITE_OTHER): Payer: Self-pay | Admitting: Bariatrics

## 2022-03-02 ENCOUNTER — Other Ambulatory Visit (INDEPENDENT_AMBULATORY_CARE_PROVIDER_SITE_OTHER): Payer: Self-pay | Admitting: Bariatrics

## 2022-03-02 DIAGNOSIS — R7303 Prediabetes: Secondary | ICD-10-CM

## 2022-03-06 ENCOUNTER — Ambulatory Visit (INDEPENDENT_AMBULATORY_CARE_PROVIDER_SITE_OTHER): Payer: BC Managed Care – PPO | Admitting: Bariatrics

## 2022-03-22 ENCOUNTER — Encounter (INDEPENDENT_AMBULATORY_CARE_PROVIDER_SITE_OTHER): Payer: Self-pay

## 2022-07-15 ENCOUNTER — Other Ambulatory Visit (INDEPENDENT_AMBULATORY_CARE_PROVIDER_SITE_OTHER): Payer: Self-pay | Admitting: Bariatrics

## 2022-07-15 DIAGNOSIS — R7303 Prediabetes: Secondary | ICD-10-CM

## 2024-03-07 LAB — OB RESULTS CONSOLE ABO/RH: RH Type: POSITIVE

## 2024-03-07 LAB — OB RESULTS CONSOLE HEPATITIS B SURFACE ANTIGEN
Hepatitis B Surface Ag: NEGATIVE
Hepatitis B Surface Ag: NEGATIVE

## 2024-03-07 LAB — OB RESULTS CONSOLE RPR: RPR: NONREACTIVE

## 2024-03-07 LAB — OB RESULTS CONSOLE HGB/HCT, BLOOD
HCT: 42 — AB (ref 29–41)
Hemoglobin: 14

## 2024-03-07 LAB — OB RESULTS CONSOLE RUBELLA ANTIBODY, IGM: Rubella: IMMUNE

## 2024-03-07 LAB — OB RESULTS CONSOLE ANTIBODY SCREEN: Antibody Screen: NEGATIVE

## 2024-03-07 LAB — OB RESULTS CONSOLE PLATELET COUNT: Platelets: 342

## 2024-03-07 LAB — OB RESULTS CONSOLE HIV ANTIBODY (ROUTINE TESTING): HIV: NONREACTIVE

## 2024-03-07 LAB — HEPATITIS C ANTIBODY: HCV Ab: NEGATIVE

## 2024-04-09 ENCOUNTER — Other Ambulatory Visit: Payer: Self-pay

## 2024-04-09 MED ORDER — CLOTRIMAZOLE 1 % VA CREA
TOPICAL_CREAM | VAGINAL | 0 refills | Status: DC
  Filled 2024-04-09: qty 45, 7d supply, fill #0

## 2024-05-15 ENCOUNTER — Encounter: Payer: Self-pay | Admitting: Student

## 2024-05-15 ENCOUNTER — Other Ambulatory Visit (HOSPITAL_COMMUNITY)
Admission: RE | Admit: 2024-05-15 | Discharge: 2024-05-15 | Disposition: A | Discharge status: Home / Self Care | Source: Ambulatory Visit | Attending: Student | Admitting: Student

## 2024-05-15 ENCOUNTER — Ambulatory Visit: Admitting: Student

## 2024-05-15 VITALS — BP 109/68 | HR 79 | Wt 225.2 lb

## 2024-05-15 DIAGNOSIS — Z3A18 18 weeks gestation of pregnancy: Secondary | ICD-10-CM

## 2024-05-15 DIAGNOSIS — Z6835 Body mass index (BMI) 35.0-35.9, adult: Secondary | ICD-10-CM | POA: Diagnosis not present

## 2024-05-15 DIAGNOSIS — R7303 Prediabetes: Secondary | ICD-10-CM

## 2024-05-15 DIAGNOSIS — Z3490 Encounter for supervision of normal pregnancy, unspecified, unspecified trimester: Secondary | ICD-10-CM | POA: Diagnosis present

## 2024-05-15 DIAGNOSIS — R7989 Other specified abnormal findings of blood chemistry: Secondary | ICD-10-CM

## 2024-05-15 MED ORDER — BLOOD PRESSURE KIT DEVI: 1.0000 | 0 refills | Status: AC

## 2024-05-15 NOTE — Progress Notes (Signed)
 Subjective:   Jasmin Oconnor is a 28 y.o. [id number] at [redacted]w[redacted]d by LMP c/w early US  being seen today for her first obstetrical visit.  Her obstetrical history includes a single, full-term vaginal birth with uncomplicated pregnancy; reports she pushed for 3 hours with first delivery and baby had a 12 day NICU stay. Some concern for a similar outcome this pregnancy. Considering waterbirth. Will continue to discuss last birth experience and plans for this pregnancy with the midwives. She has Low HDL (under 40); Prediabetes; Vitamin D  deficiency; Low TSH level; BMI 35.0-35.9,adult; and Encounter for supervision of normal pregnancy, antepartum on their problem list.. Patient does intend to breast feed. Patient reports no complaints.  HISTORY: OB History  Gravida Para Term Preterm AB Living  4 1 1  0 2 1  SAB IAB Ectopic Multiple Live Births  2 0 0 0 1    # Outcome Date GA Lbr Len/2nd Weight Sex Type Anes PTL Lv  4 Current           3 Term 12/30/20 [redacted]w[redacted]d 09:42 / 02:55 4130 g F Vag-Spont EPI  LIV     Name: Klepper,GIRL Lujean     Apgar1: 5  Apgar5: 7  2 SAB           1 SAB            Past Medical History:  Diagnosis Date   Asthma    Back pain    Constipation    Edema of both lower extremities    High blood pressure    Joint pain    Lactose intolerance    Medical history non-contributory    SOB (shortness of breath)    Past Surgical History:  Procedure Laterality Date   NO PAST SURGERIES     Family History  Problem Relation Age of Onset   Hypertension Mother    Diabetes Mother    Obesity Mother    Diabetes Father    Obesity Father    Sleep apnea Father    Social History   Tobacco Use   Smoking status: Never   Smokeless tobacco: Never  Substance Use Topics   Alcohol use: Yes    Comment: rare   Drug use: Never   No Known Allergies Current Outpatient Medications on File Prior to Visit  Medication Sig Dispense Refill   acetaminophen  (TYLENOL ) 325 MG tablet Take  650 mg by mouth every 6 (six) hours as needed.     ASPIRIN LOW DOSE 81 MG tablet Take 81 mg by mouth daily.     folic acid (FOLVITE) 400 MCG tablet Take 400 mcg by mouth daily.     clotrimazole  (GYNE-LOTRIMIN ) 1 % vaginal cream Use one application at bedtime. (Patient not taking: Reported on 05/15/2024) 45 g 0   No current facility-administered medications on file prior to visit.   Exam   Vitals:   05/15/24 1524  BP: 109/68  Pulse: 79  Weight: 102.2 kg   Fetal Heart Rate (bpm): 138  VS reviewed, nursing note reviewed Constitutional: well developed, well nourished, no distress HEENT: normocephalic CV: normal rate Pulm/chest wall: normal effort Abdomen: soft Neuro: alert and oriented x 3 Skin: warm, dry Psych: affect normal  Assessment:   Pregnancy: [id number] Patient Active Problem List   Diagnosis Date Noted   Encounter for supervision of normal pregnancy, antepartum 05/15/2024   BMI 35.0-35.9,adult 12/14/2021   Low HDL (under 40) 12/05/2021   Prediabetes 12/05/2021   Vitamin D  deficiency 12/05/2021  Low TSH level 12/05/2021   Plan:  1. Encounter for supervision of normal pregnancy, antepartum, unspecified gravidity ([place]) - Initial prenatal labs collected at [facility] - Follow-up on genetic screening plans at next visit (see MyChart message) - Considering waterbirth, information given, will schedule with CNM - Enroll Patient in PreNatal Babyscripts - US  MFM OB DETAIL +14 WK; Future - AFP, Serum, Open Spina Bifida - Culture, OB Urine - Cervicovaginal ancillary only( [place])  2. [redacted] weeks gestation of pregnancy - Return in 4 weeks for ROB  3. Prediabetes - Last recorded A1C 6.2 (12/01/21), recalls a more recent A1C of 6.1 - HgB A1c today  4. BMI 35.0-35.9,adult - Taking aspirin 81 mg daily   Initial labs drawn at [facility]. Remaining labs completed today. Continue prenatal vitamins. Discussed and offered genetic screening options, patient recalls  NIPS at [facility], but after reviewing their services it appears likely this was only [facility] testing. Hemoglobinopathies normal. Desires [facility] at next visit. NIPS offered again via MyChart message. Ultrasound discussed; fetal anatomic survey: ordered, message sent to MFM. Problem list reviewed and updated. The nature of [facility] with multiple MDs and other Advanced Practice Providers was explained to patient; also emphasized that residents, students are part of our team. Routine obstetric precautions reviewed. Return in about 4 weeks (around 06/12/2024) for LOB w midwife (waterbirth).  Vernell Ruddle, SNM 05/15/2024 6:01 PM

## 2024-05-15 NOTE — Patient Instructions (Addendum)
[employer]:     [email or web address]

## 2024-05-15 NOTE — Progress Notes (Signed)
.  New OB Intake  I connected with Jasmin Oconnor  on 05/15/24 at  3:10 PM EDT by in person and verified that I am speaking with the correct person using two identifiers. Nurse is located at [facility] and pt is located at [place].  I discussed the limitations, risks, security and privacy concerns of performing an evaluation and management service by telephone and the availability of in person appointments. I also discussed with the patient that there may be a patient responsible charge related to this service. The patient expressed understanding and agreed to proceed.  I explained I am completing New OB Intake today. We discussed EDD of 10/15/24 based on LMP of 01/09/24. Pt is G4P1021. I reviewed her allergies, medications and Medical/Surgical/OB history.    Patient Active Problem List   Diagnosis Date Noted   Class 2 obesity due to excess calories with body mass index (BMI) of 35.0 to 35.9 in adult 12/14/2021   Low HDL (under 40) 12/05/2021   Prediabetes 12/05/2021   Vitamin D  deficiency 12/05/2021   Low TSH level 12/05/2021   Post-dates pregnancy 12/29/2020   Encounter for supervision of normal first pregnancy in third trimester 12/21/2020     Concerns addressed today  Delivery Plans Plans to deliver at [facility]. Discussed the nature of our practice with multiple providers including residents and students as well as female and female providers. Due to the size of the practice, the delivering provider may not be the same as those providing prenatal care.   Patient is interested in water birth.  MyChart/Babyscripts MyChart access verified. I explained pt will have some visits in office and some virtually. Babyscripts instructions given and order placed. Patient verifies receipt of registration text/e-mail. Account successfully created and app downloaded. If patient is a candidate for Optimized scheduling, add to sticky note.   Blood Pressure Cuff/Weight Scale Blood pressure cuff ordered  for patient to pick-up from [employer]. Explained after first prenatal appt pt will check weekly and document in Babyscripts. Patient does have weight scale.  Anatomy US  Explained first scheduled US  will be around 19 weeks. Anatomy US  scheduled for TBD  Is patient a CenteringPregnancy candidate?  Accepted Declined due to  Not a candidate due to  If accepted,    Is patient a Mom+Baby Combined Care candidate?  Not a candidate   If accepted, confirm patient does not intend to move from the area for at least 12 months, then notify Mom+Baby staff  Is patient a candidate for Babyscripts Optimization? Yes, patient accepted    First visit review I reviewed new OB appt with patient. Explained pt will be seen by Nat Dauer, NP at first visit. Discussed [place] genetic screening with patient.  Panorama and Horizon.. Routine prenatal labs  Last Pap   Margy LITTIE Pizza, RN 05/15/2024  3:18 PM

## 2024-05-16 LAB — CERVICOVAGINAL ANCILLARY ONLY
Chlamydia: NEGATIVE
Comment: NEGATIVE
Comment: NORMAL
Neisseria Gonorrhea: NEGATIVE

## 2024-05-20 LAB — AFP, SERUM, OPEN SPINA BIFIDA
AFP Value: 27.8 ng/mL
Gest. Age on Collection Date: 18.1 wk
Maternal Age At EDD: 27.2 a

## 2024-05-20 LAB — HEMOGLOBIN A1C
Est. average glucose Bld gHb Est-mCnc: 126 mg/dL
Hgb A1c MFr Bld: 6 % — ABNORMAL HIGH (ref 4.8–5.6)

## 2024-05-20 LAB — URINE CULTURE, OB REFLEX

## 2024-05-20 LAB — CULTURE, OB URINE

## 2024-05-21 ENCOUNTER — Ambulatory Visit: Payer: Self-pay | Admitting: Student

## 2024-05-21 DIAGNOSIS — R7303 Prediabetes: Secondary | ICD-10-CM

## 2024-05-21 DIAGNOSIS — Z3490 Encounter for supervision of normal pregnancy, unspecified, unspecified trimester: Secondary | ICD-10-CM

## 2024-05-27 ENCOUNTER — Other Ambulatory Visit

## 2024-05-27 DIAGNOSIS — R7303 Prediabetes: Secondary | ICD-10-CM

## 2024-05-27 DIAGNOSIS — Z3490 Encounter for supervision of normal pregnancy, unspecified, unspecified trimester: Secondary | ICD-10-CM

## 2024-05-28 ENCOUNTER — Ambulatory Visit

## 2024-05-28 ENCOUNTER — Other Ambulatory Visit: Payer: Self-pay | Admitting: *Deleted

## 2024-05-28 ENCOUNTER — Ambulatory Visit: Attending: Obstetrics and Gynecology | Admitting: Obstetrics and Gynecology

## 2024-05-28 VITALS — BP 111/61

## 2024-05-28 DIAGNOSIS — R7303 Prediabetes: Secondary | ICD-10-CM | POA: Insufficient documentation

## 2024-05-28 DIAGNOSIS — Z363 Encounter for antenatal screening for malformations: Secondary | ICD-10-CM | POA: Diagnosis not present

## 2024-05-28 DIAGNOSIS — O99891 Other specified diseases and conditions complicating pregnancy: Secondary | ICD-10-CM | POA: Insufficient documentation

## 2024-05-28 DIAGNOSIS — E669 Obesity, unspecified: Secondary | ICD-10-CM

## 2024-05-28 DIAGNOSIS — E6689 Other obesity not elsewhere classified: Secondary | ICD-10-CM | POA: Diagnosis not present

## 2024-05-28 DIAGNOSIS — O99212 Obesity complicating pregnancy, second trimester: Secondary | ICD-10-CM | POA: Diagnosis not present

## 2024-05-28 DIAGNOSIS — Z3A20 20 weeks gestation of pregnancy: Secondary | ICD-10-CM | POA: Diagnosis not present

## 2024-05-28 DIAGNOSIS — O358XX0 Maternal care for other (suspected) fetal abnormality and damage, not applicable or unspecified: Secondary | ICD-10-CM

## 2024-05-28 DIAGNOSIS — Z3490 Encounter for supervision of normal pregnancy, unspecified, unspecified trimester: Secondary | ICD-10-CM | POA: Diagnosis present

## 2024-05-28 DIAGNOSIS — Z6835 Body mass index (BMI) 35.0-35.9, adult: Secondary | ICD-10-CM

## 2024-05-28 LAB — GLUCOSE TOLERANCE, 2 HOURS W/ 1HR
Glucose, 1 hour: 124 mg/dL (ref 70–179)
Glucose, 2 hour: 139 mg/dL (ref 70–152)
Glucose, Fasting: 80 mg/dL (ref 70–91)

## 2024-05-28 NOTE — Progress Notes (Signed)
 Maternal-Fetal Medicine Consultation  Name: Jasmin Oconnor  MRN: [id number]  GA: [id number] [redacted]w[redacted]d   Patient is here for fetal anatomy scan.  Patient reports she had cell-free fetal DNA screening that showed low risk for fetal aneuploidies.    Obstetrical history is significant for a term vaginal delivery in 2022 of a female infant weighing 9 pounds and 2 ounces at birth.  Her pregnancy was not complicated by gestational diabetes. Her recent hemoglobin A1c was 6%.  Patient does not have gestational diabetes on early screening.  Ultrasound Fetal biometry is consistent with her previously-established dates. Normal amniotic fluid.  No makers of aneuploidies or fetal structural defects are seen.  Patient understands the limitations of ultrasound in detecting fetal anomalies.   As maternal obesity imposes limitations on the resolution of images, fetal anomalies may be missed. I counseled the patient on previous delivery and birth weight of the newborn.  In the absence of gestational diabetes, shoulder dystocia is less likely.  I discussed the limitations of ultrasound and accurately estimating fetal weights. I recommend estimation of fetal weight at 37- or 38-weeks' gestation. She has prediabetes.  I discussed the importance of screening for gestational diabetes at 35 to [redacted] weeks gestation. Her cell free fetal DNA screening results are not available with us  today.  Recommendations -An appointment was made for her to return in 6 weeks for fetal growth assessment and completion of fetal anatomy. - Fetal growth assessments at 32 weeks and [redacted] weeks gestation.     Consultation including face-to-face (more than 50%) counseling 30 minutes.

## 2024-06-10 ENCOUNTER — Encounter: Admitting: Advanced Practice Midwife

## 2024-06-19 ENCOUNTER — Ambulatory Visit: Admitting: Obstetrics and Gynecology

## 2024-06-19 VITALS — BP 97/62 | HR 99 | Wt 224.0 lb

## 2024-06-19 DIAGNOSIS — Z3480 Encounter for supervision of other normal pregnancy, unspecified trimester: Secondary | ICD-10-CM

## 2024-06-19 DIAGNOSIS — Z3A23 23 weeks gestation of pregnancy: Secondary | ICD-10-CM | POA: Diagnosis not present

## 2024-06-19 NOTE — Progress Notes (Signed)
   PRENATAL VISIT NOTE  Subjective:  Jasmin Oconnor is a 28 y.o. G4P1021 at [redacted]w[redacted]d being seen today for ongoing prenatal care.  She is currently monitored for the following issues for this low-risk pregnancy and has Low HDL (under 40); Prediabetes; Vitamin D  deficiency; Low TSH level; BMI 35.0-35.9,adult; and Encounter for supervision of normal pregnancy, antepartum on their problem list.  Patient doing well with no acute concerns today. She reports no complaints.  Contractions: Not present. Vag. Bleeding: None.  Movement: Present. Denies leaking of fluid.   The following portions of the patient's history were reviewed and updated as appropriate: allergies, current medications, past family history, past medical history, past social history, past surgical history and problem list. Problem list updated.  Objective:   Vitals:   06/19/24 1020  BP: 97/62  Pulse: 99  Weight: 224 lb (101.6 kg)    Fetal Status: Fetal Heart Rate (bpm): 140 Fundal Height: 23 cm Movement: Present     General:  Alert, oriented and cooperative. Patient is in no acute distress.  Skin: Skin is warm and dry. No rash noted.   Cardiovascular: Normal heart rate noted  Respiratory: Normal respiratory effort, no problems with respiration noted  Abdomen: Soft, gravid, appropriate for gestational age.  Pain/Pressure: Absent     Pelvic: Cervical exam deferred        Extremities: Normal range of motion.     Mental Status:  Normal mood and affect. Normal behavior. Normal judgment and thought content.   Assessment and Plan:  Pregnancy: G4P1021 at [redacted]w[redacted]d  1. Supervision of other normal pregnancy, antepartum (Primary) Continue routine prenatal care Repeat 2 hour GTT next visit  2. 23 weeks pregnancy  Preterm labor symptoms and general obstetric precautions including but not limited to vaginal bleeding, contractions, leaking of fluid and fetal movement were reviewed in detail with the patient.  Please refer to After  Visit Summary for other counseling recommendations.   Return in about 4 weeks (around 07/17/2024) for ROB, in person, 2 hr GTT, 3rd trim labs.   Jerilynn Buddle, MD Faculty Attending Center for [facility]

## 2024-07-08 ENCOUNTER — Ambulatory Visit: Payer: Self-pay | Admitting: Advanced Practice Midwife

## 2024-07-08 ENCOUNTER — Encounter: Payer: Self-pay | Admitting: Advanced Practice Midwife

## 2024-07-08 VITALS — BP 104/67 | HR 79 | Wt 228.0 lb

## 2024-07-08 DIAGNOSIS — Z3482 Encounter for supervision of other normal pregnancy, second trimester: Secondary | ICD-10-CM | POA: Diagnosis not present

## 2024-07-08 DIAGNOSIS — Z3480 Encounter for supervision of other normal pregnancy, unspecified trimester: Secondary | ICD-10-CM

## 2024-07-08 DIAGNOSIS — Z8759 Personal history of other complications of pregnancy, childbirth and the puerperium: Secondary | ICD-10-CM

## 2024-07-08 DIAGNOSIS — Z3A25 25 weeks gestation of pregnancy: Secondary | ICD-10-CM

## 2024-07-08 DIAGNOSIS — R7309 Other abnormal glucose: Secondary | ICD-10-CM | POA: Diagnosis not present

## 2024-07-08 NOTE — Progress Notes (Signed)
 PRENATAL VISIT NOTE  Subjective:  Jasmin Oconnor is a 28 y.o. G4P1021 at [redacted]w[redacted]d being seen today for ongoing prenatal care.  She is currently monitored for the following issues for this low-risk pregnancy and has Low HDL (under 40); Prediabetes; Vitamin D  deficiency; Low TSH level; BMI 35.0-35.9,adult; and Encounter for supervision of normal pregnancy, antepartum on their problem list.  Patient reports occasional contractions.  Contractions: Not present. Vag. Bleeding: None.  Movement: Present. Denies leaking of fluid.   The following portions of the patient's history were reviewed and updated as appropriate: allergies, current medications, past family history, past medical history, past social history, past surgical history and problem list.   Objective:   Vitals:   07/08/24 1542  BP: 104/67  Pulse: 79  Weight: 228 lb (103.4 kg)    Fetal Status:  Fetal Heart Rate (bpm): 140 Fundal Height: 27 cm Movement: Present    General: Alert, oriented and cooperative. Patient is in no acute distress.  Skin: Skin is warm and dry. No rash noted.   Cardiovascular: Normal heart rate noted  Respiratory: Normal respiratory effort, no problems with respiration noted  Abdomen: Soft, gravid, appropriate for gestational age.  Pain/Pressure: Absent     Pelvic: Cervical exam deferred        Extremities: Normal range of motion.  Edema: None  Mental Status: Normal mood and affect. Normal behavior. Normal judgment and thought content.      05/15/2024    4:12 PM 12/01/2021    8:58 AM  Depression screen PHQ 2/9  Decreased Interest 0 3  Down, Depressed, Hopeless 0 2  PHQ - 2 Score 0 5  Altered sleeping 0 2  Tired, decreased energy 2 3  Change in appetite 0 1  Feeling bad or failure about yourself  1 3  Trouble concentrating 0 0  Moving slowly or fidgety/restless 0 2  Suicidal thoughts 0 0  PHQ-9 Score 3  16   Difficult doing work/chores  Very difficult     Data saved with a previous  flowsheet row definition        05/15/2024    4:12 PM  GAD 7 : Generalized Anxiety Score  Nervous, Anxious, on Edge 0  Control/stop worrying 0  Worry too much - different things 0  Trouble relaxing 0  Restless 0  Easily annoyed or irritable 1  Afraid - awful might happen 0  Total GAD 7 Score 1    Assessment and Plan:  Pregnancy: G4P1021 at [redacted]w[redacted]d 1. Supervision of other normal pregnancy, antepartum (Primary) --Anticipatory guidance about next visits/weeks of pregnancy given.   2. [redacted] weeks gestation of pregnancy   3. Elevated hemoglobin A1c --Pt with prediabietes prior to pregnancy, lowered her A1C with diet/exercise but was elevated again at 6.0 in early pregnancy. Pt reports eating more carbohydrates with nausea in pregnancy.  Now eating better. Early 2 hour GTT wnl. --Repeat 2 hour GTT at next visit at 28-29 weeks   4. History of delivery of macrosomal infant --Previous baby 9+ lbs, FHR tachycardia and decels at time of delivery. Vacuum considered but not performed.  Infant to NICU x 12 days, normal development now.    Preterm labor symptoms and general obstetric precautions including but not limited to vaginal bleeding, contractions, leaking of fluid and fetal movement were reviewed in detail with the patient. Please refer to After Visit Summary for other counseling recommendations.   Return in about 3 weeks (around 07/29/2024) for Midwife preferred, GTT at next  visit.  Future Appointments  Date Time Provider Department Center  07/09/2024  9:15 AM [facility] PROVIDER 1 [facility]  07/09/2024  9:30 AM [facility] US3 [facility]  07/28/2024  8:00 AM [facility] LAB [facility] None  07/28/2024  8:55 AM Delores Nidia CROME, FNP [facility] None    Olam Boards, CNM

## 2024-07-09 ENCOUNTER — Ambulatory Visit: Admitting: Maternal & Fetal Medicine

## 2024-07-09 ENCOUNTER — Ambulatory Visit: Attending: Obstetrics and Gynecology

## 2024-07-09 VITALS — BP 113/61 | HR 90

## 2024-07-09 DIAGNOSIS — O99810 Abnormal glucose complicating pregnancy: Secondary | ICD-10-CM

## 2024-07-09 DIAGNOSIS — E669 Obesity, unspecified: Secondary | ICD-10-CM

## 2024-07-09 DIAGNOSIS — Z3A26 26 weeks gestation of pregnancy: Secondary | ICD-10-CM

## 2024-07-09 DIAGNOSIS — O99210 Obesity complicating pregnancy, unspecified trimester: Secondary | ICD-10-CM

## 2024-07-09 DIAGNOSIS — O358XX0 Maternal care for other (suspected) fetal abnormality and damage, not applicable or unspecified: Secondary | ICD-10-CM | POA: Insufficient documentation

## 2024-07-09 DIAGNOSIS — O99212 Obesity complicating pregnancy, second trimester: Secondary | ICD-10-CM

## 2024-07-09 DIAGNOSIS — Z6835 Body mass index (BMI) 35.0-35.9, adult: Secondary | ICD-10-CM | POA: Diagnosis present

## 2024-07-09 NOTE — Progress Notes (Signed)
   Patient information  Patient Name: Jasmin Oconnor  Patient MRN:   [id number]  Referring practice: MFM Referring Provider: [facility]  Problem List   Patient Active Problem List   Diagnosis Date Noted   Encounter for supervision of normal pregnancy, antepartum 05/15/2024   BMI 35.0-35.9,adult 12/14/2021   Low HDL (under 40) 12/05/2021   Prediabetes 12/05/2021   Vitamin D  deficiency 12/05/2021   Low TSH level 12/05/2021   Maternal Fetal medicine Consult  Jasmin Oconnor is a 28 y.o. G4P1021 at [redacted]w[redacted]d here for ultrasound and consultation. Jasmin Oconnor is doing well today with no acute concerns. Today we focused on the following:   The patient is here for growth ultrasound due to elevated BMI.  There is also some anatomy that was not well-visualized previously that was seen well today and appears normal.  Due to elevated BMI she will come back at 32 weeks for growth ultrasound.  She reports good fetal movement and has no other concern at this time.  The patient had time to ask questions that were answered to her satisfaction.  She verbalized understanding and agrees to proceed with the plan below.  Recommendations -F/u growth US  at 32 week  I spent 10 minutes reviewing the patients chart, including labs and images as well as counseling the patient about her medical conditions. Greater than 50% of the time was spent in direct face-to-face patient counseling.  Delora Smaller  MFM, [place]   07/09/2024  9:48 AM   Review of Systems: A review of systems was performed and was negative except per HPI   Vitals and Physical Exam    07/09/2024    9:23 AM 07/08/2024    3:42 PM 06/19/2024   10:20 AM  Vitals with BMI  Weight  228 lbs 224 lbs  Systolic 113 104 97  Diastolic 61 67 62  Pulse 90 79 99    Sitting comfortably on the sonogram table Nonlabored breathing Normal rate and rhythm Abdomen is nontender  Past pregnancies OB History  Gravida  Para Term Preterm AB Living  4 1 1  0 2 1  SAB IAB Ectopic Multiple Live Births  2 0 0 0 1    # Outcome Date GA Lbr Len/2nd Weight Sex Type Anes PTL Lv  4 Current           3 Term 12/30/20 [redacted]w[redacted]d 09:42 / 02:55 9 lb 1.7 oz (4.13 kg) F Vag-Spont EPI  LIV  2 SAB           1 SAB              Future Appointments  Date Time Provider Department Center  07/28/2024  8:00 AM [facility] None  07/28/2024  8:55 AM Delores Nidia CROME, FNP [facility] None

## 2024-07-28 ENCOUNTER — Ambulatory Visit: Payer: Self-pay | Admitting: Obstetrics and Gynecology

## 2024-07-28 ENCOUNTER — Other Ambulatory Visit

## 2024-07-28 ENCOUNTER — Encounter: Payer: Self-pay | Admitting: Obstetrics and Gynecology

## 2024-07-28 VITALS — BP 106/65 | HR 93 | Wt 229.2 lb

## 2024-07-28 DIAGNOSIS — Z23 Encounter for immunization: Secondary | ICD-10-CM

## 2024-07-28 DIAGNOSIS — Z3A28 28 weeks gestation of pregnancy: Secondary | ICD-10-CM

## 2024-07-28 DIAGNOSIS — R7309 Other abnormal glucose: Secondary | ICD-10-CM

## 2024-07-28 DIAGNOSIS — Z8759 Personal history of other complications of pregnancy, childbirth and the puerperium: Secondary | ICD-10-CM | POA: Diagnosis not present

## 2024-07-28 DIAGNOSIS — Z3480 Encounter for supervision of other normal pregnancy, unspecified trimester: Secondary | ICD-10-CM

## 2024-07-28 DIAGNOSIS — Z3483 Encounter for supervision of other normal pregnancy, third trimester: Secondary | ICD-10-CM | POA: Diagnosis not present

## 2024-07-28 NOTE — Progress Notes (Signed)
 Pt presents for ROB visit. C/o numbness and  tingling in the hands.

## 2024-07-28 NOTE — Progress Notes (Signed)
 PRENATAL VISIT NOTE  Subjective:  Jasmin Oconnor is a 28 y.o. G4P1021 at [redacted]w[redacted]d being seen today for ongoing prenatal care.  She is currently monitored for the following issues for this low-risk pregnancy and has Low HDL (under 40); Prediabetes; Vitamin D  deficiency; Low TSH level; BMI 35.0-35.9,adult; and Encounter for supervision of normal pregnancy, antepartum on their problem list.  Patient reports numbness and tingling in hands.  Contractions: Not present. Vag. Bleeding: None.  Movement: Present. Denies leaking of fluid.   The following portions of the patient's history were reviewed and updated as appropriate: allergies, current medications, past family history, past medical history, past social history, past surgical history and problem list.   Objective:   Vitals:   07/28/24 0825  BP: 106/65  Pulse: 93  Weight: 229 lb 3.2 oz (104 kg)    Fetal Status:  Fetal Heart Rate (bpm): 145   Movement: Present    General: Alert, oriented and cooperative. Patient is in no acute distress.  Skin: Skin is warm and dry. No rash noted.   Cardiovascular: Normal heart rate noted  Respiratory: Normal respiratory effort, no problems with respiration noted  Abdomen: Soft, gravid, appropriate for gestational age.  Pain/Pressure: Present     Pelvic: Cervical exam deferred        Extremities: Normal range of motion.  Edema: Trace  Mental Status: Normal mood and affect. Normal behavior. Normal judgment and thought content.      07/28/2024    8:29 AM 05/15/2024    4:12 PM 12/01/2021    8:58 AM  Depression screen PHQ 2/9  Decreased Interest 0 0 3  Down, Depressed, Hopeless 0 0 2  PHQ - 2 Score 0 0 5  Altered sleeping 0 0 2  Tired, decreased energy 3 2 3   Change in appetite 0 0 1  Feeling bad or failure about yourself  0 1 3  Trouble concentrating 0 0 0  Moving slowly or fidgety/restless 0 0 2  Suicidal thoughts 0 0 0  PHQ-9 Score 3 3  16    Difficult doing work/chores   Very difficult      Data saved with a previous flowsheet row definition        07/28/2024    8:31 AM 05/15/2024    4:12 PM  GAD 7 : Generalized Anxiety Score  Nervous, Anxious, on Edge 0 0  Control/stop worrying 0 0  Worry too much - different things 0 0  Trouble relaxing 2 0  Restless 0 0  Easily annoyed or irritable 0 1  Afraid - awful might happen 0 0  Total GAD 7 Score 2 1    Assessment and Plan:  Pregnancy: G4P1021 at [redacted]w[redacted]d 1. Supervision of other normal pregnancy, antepartum (Primary) BP and FHR normal Doing well, feeling regular movement   - Tdap vaccine greater than or equal to 7yo IM - Flu vaccine trivalent PF, 6mos and older(Flulaval,Afluria,Fluarix,Fluzone)  2. [redacted] weeks gestation of pregnancy Tdap and flu today  Discussed support measures for numbness and tingling  Still considering waterbirth, undecided, encouraged class just in case  3. History of delivery of macrosomal infant 11/26 u/s afi normal,, EFW 34%, follow up growth 1/7  4. Elevated hemoglobin A1c Early GTT nml, repeat rescheduled for 12/22    Preterm labor symptoms and general obstetric precautions including but not limited to vaginal bleeding, contractions, leaking of fluid and fetal movement were reviewed in detail with the patient. Please refer to After Visit Summary for other  counseling recommendations.   Return in about 2 weeks (around 08/11/2024) for OB VISIT (MD or APP).  Future Appointments  Date Time Provider Department Center  08/04/2024  8:30 AM [facility] None  08/12/2024 10:15 AM Rudy Carlin LABOR, MD [facility] None  08/20/2024  9:15 AM [facility] PROVIDER 1 [facility]  08/20/2024  9:30 AM [facility] US1 [facility]    Nidia Daring, FNP

## 2024-08-04 ENCOUNTER — Other Ambulatory Visit

## 2024-08-04 DIAGNOSIS — Z3A29 29 weeks gestation of pregnancy: Secondary | ICD-10-CM

## 2024-08-04 DIAGNOSIS — Z3480 Encounter for supervision of other normal pregnancy, unspecified trimester: Secondary | ICD-10-CM

## 2024-08-05 DIAGNOSIS — Z3480 Encounter for supervision of other normal pregnancy, unspecified trimester: Secondary | ICD-10-CM

## 2024-08-05 DIAGNOSIS — O24410 Gestational diabetes mellitus in pregnancy, diet controlled: Secondary | ICD-10-CM | POA: Insufficient documentation

## 2024-08-05 LAB — CBC
Hematocrit: 32.9 % — ABNORMAL LOW (ref 34.0–46.6)
Hemoglobin: 10.7 g/dL — ABNORMAL LOW (ref 11.1–15.9)
MCH: 27.8 pg (ref 26.6–33.0)
MCHC: 32.5 g/dL (ref 31.5–35.7)
MCV: 86 fL (ref 79–97)
Platelets: 230 x10E3/uL (ref 150–450)
RBC: 3.85 x10E6/uL (ref 3.77–5.28)
RDW: 12.6 % (ref 11.7–15.4)
WBC: 5.6 x10E3/uL (ref 3.4–10.8)

## 2024-08-05 LAB — HIV ANTIBODY (ROUTINE TESTING W REFLEX): HIV Screen 4th Generation wRfx: NONREACTIVE

## 2024-08-05 LAB — GLUCOSE TOLERANCE, 2 HOURS W/ 1HR
Glucose, 1 hour: 184 mg/dL — ABNORMAL HIGH (ref 70–179)
Glucose, 2 hour: 142 mg/dL (ref 70–152)
Glucose, Fasting: 100 mg/dL — ABNORMAL HIGH (ref 70–91)

## 2024-08-05 LAB — SYPHILIS: RPR W/REFLEX TO RPR TITER AND TREPONEMAL ANTIBODIES, TRADITIONAL SCREENING AND DIAGNOSIS ALGORITHM: RPR Ser Ql: NONREACTIVE

## 2024-08-05 MED ORDER — ACCU-CHEK GUIDE TEST VI STRP: 1.0000 | ORAL_STRIP | Freq: Four times a day (QID) | 12 refills | Status: AC

## 2024-08-05 MED ORDER — ACCU-CHEK GUIDE W/DEVICE KIT: 1.0000 | PACK | Freq: Four times a day (QID) | 0 refills | Status: AC

## 2024-08-05 MED ORDER — ACCU-CHEK SOFTCLIX LANCETS MISC: 100.0000 | Freq: Four times a day (QID) | 12 refills | Status: AC

## 2024-08-05 MED ORDER — FERROUS SULFATE 325 (65 FE) MG PO TABS: 325.0000 mg | ORAL_TABLET | ORAL | 1 refills | Status: AC

## 2024-08-12 ENCOUNTER — Ambulatory Visit (INDEPENDENT_AMBULATORY_CARE_PROVIDER_SITE_OTHER): Payer: Self-pay | Admitting: Obstetrics

## 2024-08-12 ENCOUNTER — Encounter: Payer: Self-pay | Admitting: Obstetrics

## 2024-08-12 VITALS — BP 123/70 | HR 97 | Wt 228.0 lb

## 2024-08-12 DIAGNOSIS — O24410 Gestational diabetes mellitus in pregnancy, diet controlled: Secondary | ICD-10-CM | POA: Diagnosis not present

## 2024-08-12 DIAGNOSIS — O0993 Supervision of high risk pregnancy, unspecified, third trimester: Secondary | ICD-10-CM | POA: Diagnosis not present

## 2024-08-12 DIAGNOSIS — O99213 Obesity complicating pregnancy, third trimester: Secondary | ICD-10-CM | POA: Diagnosis not present

## 2024-08-12 DIAGNOSIS — O0990 Supervision of high risk pregnancy, unspecified, unspecified trimester: Secondary | ICD-10-CM

## 2024-08-12 DIAGNOSIS — O99210 Obesity complicating pregnancy, unspecified trimester: Secondary | ICD-10-CM

## 2024-08-12 DIAGNOSIS — Z3A30 30 weeks gestation of pregnancy: Secondary | ICD-10-CM

## 2024-08-12 NOTE — Progress Notes (Signed)
 Subjective:  Jasmin Oconnor is a 28 y.o. H5E8978 at [redacted]w[redacted]d being seen today for ongoing prenatal care.  She is currently monitored for the following issues for this high-risk pregnancy and has Low HDL (under 40); Prediabetes; Vitamin D  deficiency; Low TSH level; BMI 35.0-35.9,adult; Encounter for supervision of normal pregnancy, antepartum; and Diet controlled gestational diabetes mellitus (GDM) on their problem list.  Patient reports no complaints.  Contractions: Not present. Vag. Bleeding: None.  Movement: Present. Denies leaking of fluid.   The following portions of the patient's history were reviewed and updated as appropriate: allergies, current medications, past family history, past medical history, past social history, past surgical history and problem list. Problem list updated.  Objective:   Vitals:   08/12/24 1020  BP: 123/70  Pulse: 97  Weight: 228 lb (103.4 kg)    Fetal Status: Fetal Heart Rate (bpm): 140   Movement: Present     General:  Alert, oriented and cooperative. Patient is in no acute distress.  Skin: Skin is warm and dry. No rash noted.   Cardiovascular: Normal heart rate noted  Respiratory: Normal respiratory effort, no problems with respiration noted  Abdomen: Soft, gravid, appropriate for gestational age. Pain/Pressure: Present     Pelvic:  Cervical exam deferred        Extremities: Normal range of motion.  Edema: None  Mental Status: Normal mood and affect. Normal behavior. Normal judgment and thought content.   Urinalysis:      Assessment and Plan:  Pregnancy: G4P1021 at [redacted]w[redacted]d  1. Supervision of high risk pregnancy, antepartum (Primary)  2. Diet controlled gestational diabetes mellitus (GDM), antepartum - not monitoring glucose yet.  Has not gotten supplies yet - encouraged to get self monitoring supplies and get started ASAP  3. Obesity affecting pregnancy, antepartum, unspecified obesity type   There are no diagnoses linked to this  encounter. Preterm labor symptoms and general obstetric precautions including but not limited to vaginal bleeding, contractions, leaking of fluid and fetal movement were reviewed in detail with the patient. Please refer to After Visit Summary for other counseling recommendations.   Return in about 2 weeks (around 08/26/2024) for [facility]   Rudy Carlin LABOR, MD 08/12/2024

## 2024-08-12 NOTE — Progress Notes (Signed)
 Pt presents for rob. Pt wants to know what brand of lubricants she can use that will not irritate her. Pt has no other questions or concerns at this time.

## 2024-08-19 ENCOUNTER — Ambulatory Visit (INDEPENDENT_AMBULATORY_CARE_PROVIDER_SITE_OTHER): Payer: Self-pay | Admitting: Dietician

## 2024-08-19 ENCOUNTER — Other Ambulatory Visit: Payer: Self-pay

## 2024-08-19 ENCOUNTER — Encounter: Attending: Obstetrics and Gynecology | Admitting: Dietician

## 2024-08-19 DIAGNOSIS — Z3A31 31 weeks gestation of pregnancy: Secondary | ICD-10-CM

## 2024-08-19 DIAGNOSIS — Z3A00 Weeks of gestation of pregnancy not specified: Secondary | ICD-10-CM | POA: Diagnosis not present

## 2024-08-19 DIAGNOSIS — O24410 Gestational diabetes mellitus in pregnancy, diet controlled: Secondary | ICD-10-CM | POA: Insufficient documentation

## 2024-08-19 DIAGNOSIS — Z713 Dietary counseling and surveillance: Secondary | ICD-10-CM | POA: Diagnosis not present

## 2024-08-19 NOTE — Progress Notes (Signed)
 Patient was seen for Gestational Diabetes on 08/20/2023  Start time 1535 and End time 1625   Estimated due date: 10/16/2023; [redacted]w[redacted]d   Clinical: Medications: prenatal vitamin, baby Asprin Medical History: GDM Labs: OGTT fasting 100, 1 hour 184, 2 hour 142 on 08/04/2024, A1c 6% on 05/15/2024  Dietary and Lifestyle History: Patient lives with her husband and daughter.  She does the shopping and cooking. She is a neurosurgeon.  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  Physical Activity: none other than walking related to work.  Was consistently exercising since pregnancy. Stress: medium Sleep: fair as she is waking 2-3 times per night to use the bathroom  24 hr Recall:  First Meal:  super donut, coffee with raspberry vanilla creamer Snack:  none Second meal:  cheeseburger, strawberries Snack:  none Third meal:  tortilla chips and spinach queso dip Snack:  none Beverages:  water, coffee with flavored creamer, flavored water  NUTRITION INTERVENTION  Nutrition education (E-1) on the following topics:   Initial Follow-up  [x]  []  Definition of Gestational Diabetes [x]  []  Why dietary management is important in controlling blood glucose Poorly controlled diabetes can lead to fetal macrosomia, shoulder dystocia and neurological injuries, stillbirth and neonatal complications including respiratory distress syndrome, hypoglycemia and prolonged NICU admission.  [x]  []  Effects each nutrient has on blood glucose levels [x]  []  Simple carbohydrates vs complex carbohydrates [x]  []  Fluid intake [x]  []  Creating a balanced meal plan [x]  []  Carbohydrate counting  [x]  []  When to check blood glucose levels [x]  []  Proper blood glucose monitoring techniques [x]  []  Effect of stress and stress reduction techniques  [x]  []  Exercise effect on blood glucose levels, appropriate exercise during pregnancy []  []  Importance of limiting caffeine and abstaining from  alcohol and smoking [x]  []  Medications used for blood sugar control during pregnancy [x]  []  Hypoglycemia and rule of 15 [x]  []  Postpartum self care  Blood glucose monitor given: Accu Chek Guide Me Lot # [id number] Exp: 01/13/2025 CBG: 110 mg/dL 3.5 hours after lunch  Patient instructed to monitor glucose levels: FBS: 60 - <= 95 mg/dL; 2 hour: <= 879 mg/dL  Patient received handouts: Nutrition Diabetes and Pregnancy Carbohydrate Counting List Blood glucose log Snack ideas for diabetes during pregnancy  Patient will be seen for follow-up as needed.

## 2024-08-20 ENCOUNTER — Ambulatory Visit: Attending: Maternal & Fetal Medicine

## 2024-08-20 ENCOUNTER — Ambulatory Visit (HOSPITAL_BASED_OUTPATIENT_CLINIC_OR_DEPARTMENT_OTHER): Admitting: Maternal & Fetal Medicine

## 2024-08-20 VITALS — BP 101/58 | HR 80

## 2024-08-20 DIAGNOSIS — O24410 Gestational diabetes mellitus in pregnancy, diet controlled: Secondary | ICD-10-CM | POA: Diagnosis present

## 2024-08-20 DIAGNOSIS — O99210 Obesity complicating pregnancy, unspecified trimester: Secondary | ICD-10-CM | POA: Insufficient documentation

## 2024-08-20 DIAGNOSIS — Z3A32 32 weeks gestation of pregnancy: Secondary | ICD-10-CM | POA: Insufficient documentation

## 2024-08-20 DIAGNOSIS — O09293 Supervision of pregnancy with other poor reproductive or obstetric history, third trimester: Secondary | ICD-10-CM

## 2024-08-20 DIAGNOSIS — O99213 Obesity complicating pregnancy, third trimester: Secondary | ICD-10-CM

## 2024-08-20 DIAGNOSIS — E669 Obesity, unspecified: Secondary | ICD-10-CM

## 2024-08-20 NOTE — Progress Notes (Signed)
 "  Patient information  Patient Name: Jasmin Oconnor  Patient MRN:   [id number]  Referring practice: MFM Referring Provider: [facility]  Problem List   Patient Active Problem List   Diagnosis Date Noted   Diet controlled gestational diabetes mellitus (GDM) 08/05/2024   Encounter for supervision of normal pregnancy, antepartum 05/15/2024   BMI 35.0-35.9,adult 12/14/2021   Low HDL (under 40) 12/05/2021   Prediabetes 12/05/2021   Vitamin D  deficiency 12/05/2021   Low TSH level 12/05/2021    Maternal Fetal medicine Consult  Jasmin Oconnor is a 29 y.o. G4P1021 at [redacted]w[redacted]d here for ultrasound and consultation. Jasmin Oconnor is doing well today with no acute concerns. Today we focused on the following:   The patient was recently diagnosed with gestational diabetes mellitus and has not yet begun self-monitoring of blood glucose. I reviewed blood glucose targets with the patient, with the goal of achieving at least three out of four daily values within range. We discussed that if adequate glycemic control cannot be achieved with diet and exercise alone, pharmacologic therapy should be initiated, with insulin  as the first-line treatment.  We reviewed the importance of appropriate glycemic control to reduce adverse perinatal outcomes, including risks to the fetus such as hypoxemia, which may contribute to complications including stillbirth. I counseled the patient that when blood glucose values are well controlled with diet and exercise, the risk of stillbirth is not increased compared with the general obstetric population.  The patient was counseled that serial growth ultrasounds will be required every four to six weeks during pregnancy to monitor fetal growth and estimated fetal weight. We discussed that delivery timing will likely be around [redacted] weeks gestation, or earlier if clinically indicated, particularly in the setting of poor glycemic control, polyhydramnios, or a  large-for-gestational-age fetus. I reassured the patient that fetal biometry today is within the normal range.  Recommendations -Begin self-monitoring of blood glucose as instructed, with the goal of achieving at least three out of four daily glucose values within target range. -Implement dietary modification and regular physical activity as first-line therapy for gestational diabetes. -Initiate pharmacologic therapy if glycemic targets are not met with diet and exercise alone (goal of at least 3/4 at goal); insulin  is recommended as first-line medication. -Maintain strict glycemic control to reduce risks of adverse perinatal outcomes, including fetal hypoxemia and stillbirth. -Growth ultrasounds in 4 weeks to monitor estimated fetal weight and assess for complications such as macrosomia or polyhydramnios. -Anticipate delivery around [redacted] weeks gestation if blood glucose values remain well controlled, with consideration of earlier delivery if indicated by poor glycemic control, polyhydramnios, or a large-for-gestational-age fetus. -Continue routine prenatal care and diabetes follow-up, with reassurance that current fetal biometry is within the normal range.  The patient had time to ask questions that were answered to her satisfaction.  She verbalized understanding and agrees to proceed with the plan above.   I spent 40 minutes reviewing the patients chart, including labs and images as well as counseling the patient about her medical conditions. Greater than 50% of the time was spent in direct face-to-face patient counseling.  Delora Smaller  MFM, [place]   08/20/2024  10:20 AM   Review of Systems: A review of systems was performed and was negative except per HPI   Vitals and Physical Exam    08/20/2024    9:15 AM 08/12/2024   10:20 AM 07/28/2024    8:25 AM  Vitals with BMI  Weight  228  lbs 229 lbs 3 oz  Systolic 101 123 893  Diastolic 58 70 65  Pulse 80 97 93    Sitting comfortably  on the sonogram table Nonlabored breathing Normal rate and rhythm Abdomen is nontender  Past pregnancies OB History  Gravida Para Term Preterm AB Living  4 1 1  0 2 1  SAB IAB Ectopic Multiple Live Births  2 0 0 0 1    # Outcome Date GA Lbr Len/2nd Weight Sex Type Anes PTL Lv  4 Current           3 Term 12/30/20 [redacted]w[redacted]d 09:42 / 02:55 9 lb 1.7 oz (4.13 kg) F Vag-Spont EPI  LIV  2 SAB           1 SAB              Future Appointments  Date Time Provider Department Center  08/28/2024  3:50 PM Davis, Devon E, PA-C [facility] None      "

## 2024-08-26 ENCOUNTER — Other Ambulatory Visit: Payer: Self-pay

## 2024-08-26 MED ORDER — TERCONAZOLE 0.8 % VA CREA: 1.0000 | TOPICAL_CREAM | Freq: Every day | VAGINAL | 0 refills | Status: AC

## 2024-08-28 ENCOUNTER — Telehealth: Payer: Self-pay | Admitting: Physician Assistant

## 2024-08-28 VITALS — Wt 228.5 lb

## 2024-08-28 DIAGNOSIS — Z3480 Encounter for supervision of other normal pregnancy, unspecified trimester: Secondary | ICD-10-CM

## 2024-08-28 DIAGNOSIS — O24410 Gestational diabetes mellitus in pregnancy, diet controlled: Secondary | ICD-10-CM

## 2024-08-28 DIAGNOSIS — Z3A33 33 weeks gestation of pregnancy: Secondary | ICD-10-CM

## 2024-08-28 DIAGNOSIS — Z6835 Body mass index (BMI) 35.0-35.9, adult: Secondary | ICD-10-CM

## 2024-08-28 NOTE — Progress Notes (Signed)
 "   I connected with Jasmin Oconnor today, 1/15/2 at 4:10 pm by: MyChart video and verified that I am speaking with the correct person using two identifiers.  Patient is located at home and provider is located at [facility].     I discussed the limitations, risks, security and privacy concerns of performing an evaluation and management service by MyChart video and the availability of in person appointments. I also discussed with the patient that there may be a patient responsible charge related to this service. By engaging in this virtual visit, you consent to the provision of healthcare.  Additionally, you authorize for your insurance to be billed for the services provided during this visit.  The patient expressed understanding and agreed to proceed.  PRENATAL VISIT NOTE  Subjective:  Jasmin Oconnor is a 29 y.o. H5E8978 at [redacted]w[redacted]d being seen today for ongoing prenatal care.  She is currently monitored for the following issues for this high-risk pregnancy and has Low HDL (under 40); Prediabetes; Vitamin D  deficiency; Low TSH level; BMI 35.0-35.9,adult; Encounter for supervision of normal pregnancy, antepartum; and Diet controlled gestational diabetes mellitus (GDM) on their problem list.  Patient reports no complaints.  Contractions: Not present. Vag. Bleeding: None.  Movement: Present. Denies leaking of fluid.   The following portions of the patient's history were reviewed and updated as appropriate: allergies, current medications, past family history, past medical history, past social history, past surgical history and problem list.   Objective:   Vitals:   08/28/24 1547  Weight: 228 lb 8 oz (103.6 kg)    Fetal Status:      Movement: Present    General: Alert, oriented and cooperative. Patient is in no acute distress.  Skin: Skin is warm and dry. No rash noted.   Cardiovascular: Normal heart rate noted  Respiratory: Normal respiratory effort, no problems with respiration  noted  Abdomen: Soft, gravid, appropriate for gestational age.  Pain/Pressure: Present     Pelvic: Cervical exam deferred        Extremities: Normal range of motion.  Edema: None  Mental Status: Normal mood and affect. Normal behavior. Normal judgment and thought content.      07/28/2024    8:29 AM 05/15/2024    4:12 PM 12/01/2021    8:58 AM  Depression screen PHQ 2/9  Decreased Interest 0 0 3  Down, Depressed, Hopeless 0 0 2  PHQ - 2 Score 0 0 5  Altered sleeping 0 0 2  Tired, decreased energy 3 2 3   Change in appetite 0 0 1  Feeling bad or failure about yourself  0 1 3  Trouble concentrating 0 0 0  Moving slowly or fidgety/restless 0 0 2  Suicidal thoughts 0 0 0  PHQ-9 Score 3 3  16    Difficult doing work/chores   Very difficult     Data saved with a previous flowsheet row definition        07/28/2024    8:31 AM 05/15/2024    4:12 PM  GAD 7 : Generalized Anxiety Score  Nervous, Anxious, on Edge 0 0  Control/stop worrying 0 0  Worry too much - different things 0 0  Trouble relaxing 2 0  Restless 0 0  Easily annoyed or irritable 0 1  Afraid - awful might happen 0 0  Total GAD 7 Score 2 1    Assessment and Plan:  Pregnancy: G4P1021 at [redacted]w[redacted]d  1. Supervision of other normal pregnancy, antepartum (Primary) Patient doing  well, feeling regular fetal movement  2. [redacted] weeks gestation of pregnancy Anticipatory guidance about next visits/weeks of pregnancy given.   3. Diet controlled gestational diabetes mellitus (GDM) in third trimester Poor control so far but patient has not been able to change her diet quite yet. She will go to the grocery store this week to choose healthier items.  Fasting: 103-104 PP: 111-146  4. BMI 35.0-35.9,adult Continue ASA  Preterm labor symptoms and general obstetric precautions including but not limited to vaginal bleeding, contractions, leaking of fluid and fetal movement were reviewed in detail with the patient.  Please refer to After  Visit Summary for other counseling recommendations.   Return in about 2 weeks (around 09/11/2024) for [facility].  Future Appointments  Date Time Provider Department Center  09/18/2024 11:15 AM [facility] PROVIDER 1 [facility]  09/18/2024 11:30 AM [facility] US3 [facility]    Jorene FORBES Moats, PA-C  "

## 2024-08-28 NOTE — Progress Notes (Signed)
 Pt presents for rob. Pt has no questions or concerns at this time.

## 2024-09-18 ENCOUNTER — Other Ambulatory Visit: Payer: Self-pay | Admitting: Maternal & Fetal Medicine

## 2024-09-18 ENCOUNTER — Ambulatory Visit

## 2024-09-18 ENCOUNTER — Ambulatory Visit: Admitting: Obstetrics

## 2024-09-18 VITALS — BP 108/60 | HR 86

## 2024-09-18 DIAGNOSIS — O99213 Obesity complicating pregnancy, third trimester: Secondary | ICD-10-CM

## 2024-09-18 DIAGNOSIS — Z3A36 36 weeks gestation of pregnancy: Secondary | ICD-10-CM

## 2024-09-18 DIAGNOSIS — E669 Obesity, unspecified: Secondary | ICD-10-CM | POA: Diagnosis not present

## 2024-09-18 DIAGNOSIS — O09293 Supervision of pregnancy with other poor reproductive or obstetric history, third trimester: Secondary | ICD-10-CM

## 2024-09-18 DIAGNOSIS — O24410 Gestational diabetes mellitus in pregnancy, diet controlled: Secondary | ICD-10-CM | POA: Diagnosis not present

## 2024-09-18 NOTE — Progress Notes (Signed)
 MFM Consult Note  Jasmin Oconnor is currently at [redacted]w[redacted]d. She has been followed due to maternal obesity with a BMI of 36.4 and diet-controlled gestational diabetes.    The patient reports that she has not been checking her fingerstick values as she cannot afford the equipment.    She reports that she received a 1 week supply of the testing supplies for free when she was first diagnosed with gestational diabetes.  Her fingerstick values were elevated during the 1 week that she was checking her fingersticks.  Sonographic findings Single intrauterine pregnancy at 36w 1d. Fetal cardiac activity: Observed. Presentation: Cephalic. Fetal biometry shows the estimated fetal weight of 7 lb,  3173g (82%). Amniotic fluid: Within normal limits. AFI: 12.75 cm.  MVP: 4.79 cm. Placenta: Posterior. BPP: 8/8.   As the patient has not been checking her fingerstick values and as she had probably has poor glycemic control, delivery may be considered at between 37 to 38 weeks (next week).    She will discuss scheduling an induction with you during her next prenatal visit.    Fetal kick count instructions were reviewed.    No further exams were scheduled in our office.  The patient stated that all of her questions were answered.   A total of 20 minutes was spent counseling and coordinating the care for this patient.  Greater than 50% of the time was spent in direct face-to-face contact.

## 2024-09-19 NOTE — Progress Notes (Unsigned)
 "  PRENATAL VISIT NOTE  Subjective:  Jasmin Oconnor is a 29 y.o. H5E8978 at [redacted]w[redacted]d being seen today for ongoing prenatal care.  She is currently monitored for the following issues for this {Blank single:19197::high-risk,low-risk} pregnancy and has Low HDL (under 40); Prediabetes; Vitamin D  deficiency; Low TSH level; BMI 35.0-35.9,adult; Encounter for supervision of normal pregnancy, antepartum; and Diet controlled gestational diabetes mellitus (GDM) on their problem list.  Patient reports {sx:14538}.   .  .   . Denies leaking of fluid.   The following portions of the patient's history were reviewed and updated as appropriate: allergies, current medications, past family history, past medical history, past social history, past surgical history and problem list.   Objective:   There were no vitals filed for this visit.  Fetal Status:           General: Alert, oriented and cooperative. Patient is in no acute distress.  Skin: Skin is warm and dry. No rash noted.   Cardiovascular: Normal heart rate noted  Respiratory: Normal respiratory effort, no problems with respiration noted  Abdomen: Soft, gravid, appropriate for gestational age.        Pelvic: {Blank single:19197::Cervical exam performed in the presence of a chaperone,Cervical exam deferred}        Extremities: Normal range of motion.     Mental Status: Normal mood and affect. Normal behavior. Normal judgment and thought content.      07/28/2024    8:29 AM 05/15/2024    4:12 PM 12/01/2021    8:58 AM  Depression screen PHQ 2/9  Decreased Interest 0 0 3  Down, Depressed, Hopeless 0 0 2  PHQ - 2 Score 0 0 5  Altered sleeping 0 0 2  Tired, decreased energy 3 2 3   Change in appetite 0 0 1  Feeling bad or failure about yourself  0 1 3  Trouble concentrating 0 0 0  Moving slowly or fidgety/restless 0 0 2  Suicidal thoughts 0 0 0  PHQ-9 Score 3 3  16    Difficult doing work/chores   Very difficult     Data saved with a  previous flowsheet row definition        07/28/2024    8:31 AM 05/15/2024    4:12 PM  GAD 7 : Generalized Anxiety Score  Nervous, Anxious, on Edge 0  0   Control/stop worrying 0  0   Worry too much - different things 0  0   Trouble relaxing 2  0   Restless 0  0   Easily annoyed or irritable 0  1   Afraid - awful might happen 0  0   Total GAD 7 Score 2 1     Data saved with a previous flowsheet row definition    Assessment and Plan:  Pregnancy: G4P1021 at [redacted]w[redacted]d 1. Supervision of other normal pregnancy, antepartum (Primary) - Doing well, feeling regular and vigorous fetal movement  2. [redacted] weeks gestation of pregnancy - Routine PNC, anticipatory guidance.  - GBS and GC swabs today  3. Diet controlled gestational diabetes mellitus (GDM) in third trimester - Reviewed sugar log  4. BMI 35.0-35.9,adult 5. History of delivery of macrosomal infant - From noted 07/08/24 --Previous baby 9+ lbs, FHR tachycardia and decels at time of delivery. Vacuum considered but not performed.  Infant to NICU x 12 days, normal development now.  - Per MFM delivery between 37 and 38 weeks, would like scheduled 09/29/24.   - Pt is interested in  waterbirth.  No contraindications at this time per chart review/patient assessment.   - Pt to enroll in class, see CNMs for most visits in the office.  - Discussed waterbirth as option for low-risk pregnancy.  Reviewed conditions that may arise during pregnancy that will risk pt out of waterbirth including hypertension, diabetes, fetal growth restriction <10%ile, etc.    Preterm labor symptoms and general obstetric precautions including but not limited to vaginal bleeding, contractions, leaking of fluid and fetal movement were reviewed in detail with the patient. Please refer to After Visit Summary for other counseling recommendations.   No follow-ups on file.  Future Appointments  Date Time Provider Department Center  09/23/2024 10:15 AM Regino, Camie LABOR,  CNM [facility] None    Camie LABOR Regino, CNM  "

## 2024-09-23 ENCOUNTER — Ambulatory Visit: Payer: Self-pay | Admitting: Certified Nurse Midwife

## 2024-09-23 DIAGNOSIS — Z6835 Body mass index (BMI) 35.0-35.9, adult: Secondary | ICD-10-CM

## 2024-09-23 DIAGNOSIS — Z3480 Encounter for supervision of other normal pregnancy, unspecified trimester: Secondary | ICD-10-CM

## 2024-09-23 DIAGNOSIS — Z8759 Personal history of other complications of pregnancy, childbirth and the puerperium: Secondary | ICD-10-CM

## 2024-09-23 DIAGNOSIS — Z3A36 36 weeks gestation of pregnancy: Secondary | ICD-10-CM

## 2024-09-23 DIAGNOSIS — O24410 Gestational diabetes mellitus in pregnancy, diet controlled: Secondary | ICD-10-CM
